# Patient Record
Sex: Male | Born: 1950 | Race: Black or African American | Hispanic: No | Smoking: Never smoker
Health system: Southern US, Community
[De-identification: ages and names within clinical notes are randomized; demographics above are authoritative.]

## PROBLEM LIST (undated history)

## (undated) DIAGNOSIS — I96 Gangrene, not elsewhere classified: Secondary | ICD-10-CM

## (undated) DIAGNOSIS — E1142 Type 2 diabetes mellitus with diabetic polyneuropathy: Secondary | ICD-10-CM

## (undated) DIAGNOSIS — G4733 Obstructive sleep apnea (adult) (pediatric): Secondary | ICD-10-CM

## (undated) DIAGNOSIS — E7800 Pure hypercholesterolemia, unspecified: Secondary | ICD-10-CM

## (undated) DIAGNOSIS — Z9989 Dependence on other enabling machines and devices: Secondary | ICD-10-CM

## (undated) DIAGNOSIS — E119 Type 2 diabetes mellitus without complications: Secondary | ICD-10-CM

## (undated) DIAGNOSIS — I10 Essential (primary) hypertension: Secondary | ICD-10-CM

## (undated) HISTORY — PX: ROTATOR CUFF REPAIR: SHX139

---

## 1990-05-26 HISTORY — PX: HEEL SPUR EXCISION: SHX1733

## 2017-10-20 ENCOUNTER — Other Ambulatory Visit: Payer: Self-pay | Admitting: Physician Assistant

## 2017-10-20 DIAGNOSIS — H93A1 Pulsatile tinnitus, right ear: Secondary | ICD-10-CM

## 2017-10-30 ENCOUNTER — Ambulatory Visit
Admission: RE | Admit: 2017-10-30 | Discharge: 2017-10-30 | Disposition: A | Discharge status: Home / Self Care | Payer: Medicare Other | Source: Ambulatory Visit | Attending: Physician Assistant | Admitting: Physician Assistant

## 2017-10-30 DIAGNOSIS — H93A1 Pulsatile tinnitus, right ear: Secondary | ICD-10-CM

## 2017-10-30 MED ORDER — GADOBENATE DIMEGLUMINE 529 MG/ML IV SOLN
14.0000 mL | Freq: Once | INTRAVENOUS | Status: AC | PRN
  Administered 2017-10-30: 14 mL via INTRAVENOUS

## 2017-12-21 ENCOUNTER — Inpatient Hospital Stay (HOSPITAL_BASED_OUTPATIENT_CLINIC_OR_DEPARTMENT_OTHER)
Admission: EM | Admit: 2017-12-21 | Discharge: 2017-12-29 | DRG: 853 | Disposition: A | Discharge status: Skilled Nursing Facility | Payer: Medicare Other | Attending: Family Medicine | Admitting: Family Medicine

## 2017-12-21 ENCOUNTER — Emergency Department (HOSPITAL_BASED_OUTPATIENT_CLINIC_OR_DEPARTMENT_OTHER): Payer: Medicare Other

## 2017-12-21 ENCOUNTER — Encounter (HOSPITAL_BASED_OUTPATIENT_CLINIC_OR_DEPARTMENT_OTHER): Payer: Self-pay | Admitting: Adult Health

## 2017-12-21 ENCOUNTER — Other Ambulatory Visit: Payer: Self-pay

## 2017-12-21 DIAGNOSIS — A419 Sepsis, unspecified organism: Principal | ICD-10-CM | POA: Diagnosis present

## 2017-12-21 DIAGNOSIS — S88112A Complete traumatic amputation at level between knee and ankle, left lower leg, initial encounter: Secondary | ICD-10-CM

## 2017-12-21 DIAGNOSIS — I96 Gangrene, not elsewhere classified: Secondary | ICD-10-CM

## 2017-12-21 DIAGNOSIS — I10 Essential (primary) hypertension: Secondary | ICD-10-CM | POA: Diagnosis present

## 2017-12-21 DIAGNOSIS — Z79899 Other long term (current) drug therapy: Secondary | ICD-10-CM

## 2017-12-21 DIAGNOSIS — Z7982 Long term (current) use of aspirin: Secondary | ICD-10-CM

## 2017-12-21 DIAGNOSIS — E1152 Type 2 diabetes mellitus with diabetic peripheral angiopathy with gangrene: Secondary | ICD-10-CM | POA: Diagnosis present

## 2017-12-21 DIAGNOSIS — R6520 Severe sepsis without septic shock: Secondary | ICD-10-CM | POA: Diagnosis present

## 2017-12-21 DIAGNOSIS — E861 Hypovolemia: Secondary | ICD-10-CM | POA: Diagnosis present

## 2017-12-21 DIAGNOSIS — G4733 Obstructive sleep apnea (adult) (pediatric): Secondary | ICD-10-CM | POA: Diagnosis present

## 2017-12-21 DIAGNOSIS — H40059 Ocular hypertension, unspecified eye: Secondary | ICD-10-CM | POA: Diagnosis present

## 2017-12-21 DIAGNOSIS — E1165 Type 2 diabetes mellitus with hyperglycemia: Secondary | ICD-10-CM | POA: Diagnosis present

## 2017-12-21 DIAGNOSIS — E871 Hypo-osmolality and hyponatremia: Secondary | ICD-10-CM | POA: Diagnosis present

## 2017-12-21 DIAGNOSIS — E7800 Pure hypercholesterolemia, unspecified: Secondary | ICD-10-CM | POA: Diagnosis present

## 2017-12-21 DIAGNOSIS — E119 Type 2 diabetes mellitus without complications: Secondary | ICD-10-CM

## 2017-12-21 DIAGNOSIS — A480 Gas gangrene: Secondary | ICD-10-CM | POA: Diagnosis present

## 2017-12-21 DIAGNOSIS — D649 Anemia, unspecified: Secondary | ICD-10-CM | POA: Diagnosis not present

## 2017-12-21 DIAGNOSIS — G8918 Other acute postprocedural pain: Secondary | ICD-10-CM

## 2017-12-21 DIAGNOSIS — R17 Unspecified jaundice: Secondary | ICD-10-CM | POA: Diagnosis present

## 2017-12-21 DIAGNOSIS — I129 Hypertensive chronic kidney disease with stage 1 through stage 4 chronic kidney disease, or unspecified chronic kidney disease: Secondary | ICD-10-CM | POA: Diagnosis present

## 2017-12-21 DIAGNOSIS — R112 Nausea with vomiting, unspecified: Secondary | ICD-10-CM

## 2017-12-21 DIAGNOSIS — E43 Unspecified severe protein-calorie malnutrition: Secondary | ICD-10-CM | POA: Diagnosis present

## 2017-12-21 DIAGNOSIS — R197 Diarrhea, unspecified: Secondary | ICD-10-CM

## 2017-12-21 DIAGNOSIS — N182 Chronic kidney disease, stage 2 (mild): Secondary | ICD-10-CM | POA: Diagnosis present

## 2017-12-21 DIAGNOSIS — Z23 Encounter for immunization: Secondary | ICD-10-CM

## 2017-12-21 DIAGNOSIS — S91332A Puncture wound without foreign body, left foot, initial encounter: Secondary | ICD-10-CM | POA: Diagnosis present

## 2017-12-21 DIAGNOSIS — S91342A Puncture wound with foreign body, left foot, initial encounter: Secondary | ICD-10-CM | POA: Diagnosis present

## 2017-12-21 DIAGNOSIS — E1142 Type 2 diabetes mellitus with diabetic polyneuropathy: Secondary | ICD-10-CM | POA: Diagnosis present

## 2017-12-21 DIAGNOSIS — R531 Weakness: Secondary | ICD-10-CM

## 2017-12-21 DIAGNOSIS — R739 Hyperglycemia, unspecified: Secondary | ICD-10-CM

## 2017-12-21 DIAGNOSIS — Z7984 Long term (current) use of oral hypoglycemic drugs: Secondary | ICD-10-CM

## 2017-12-21 DIAGNOSIS — IMO0002 Reserved for concepts with insufficient information to code with codable children: Secondary | ICD-10-CM

## 2017-12-21 DIAGNOSIS — W450XXA Nail entering through skin, initial encounter: Secondary | ICD-10-CM

## 2017-12-21 DIAGNOSIS — E1122 Type 2 diabetes mellitus with diabetic chronic kidney disease: Secondary | ICD-10-CM | POA: Diagnosis present

## 2017-12-21 DIAGNOSIS — N289 Disorder of kidney and ureter, unspecified: Secondary | ICD-10-CM

## 2017-12-21 HISTORY — DX: Type 2 diabetes mellitus with diabetic polyneuropathy: E11.42

## 2017-12-21 HISTORY — DX: Pure hypercholesterolemia, unspecified: E78.00

## 2017-12-21 HISTORY — DX: Dependence on other enabling machines and devices: Z99.89

## 2017-12-21 HISTORY — DX: Type 2 diabetes mellitus without complications: E11.9

## 2017-12-21 HISTORY — DX: Gangrene, not elsewhere classified: I96

## 2017-12-21 HISTORY — DX: Obstructive sleep apnea (adult) (pediatric): G47.33

## 2017-12-21 HISTORY — DX: Essential (primary) hypertension: I10

## 2017-12-21 LAB — COMPREHENSIVE METABOLIC PANEL
ALT: 32 U/L (ref 0–44)
AST: 38 U/L (ref 15–41)
Albumin: 2.7 g/dL — ABNORMAL LOW (ref 3.5–5.0)
Alkaline Phosphatase: 105 U/L (ref 38–126)
Anion gap: 15 (ref 5–15)
BUN: 31 mg/dL — ABNORMAL HIGH (ref 8–23)
CHLORIDE: 90 mmol/L — AB (ref 98–111)
CO2: 22 mmol/L (ref 22–32)
CREATININE: 1.52 mg/dL — AB (ref 0.61–1.24)
Calcium: 8.6 mg/dL — ABNORMAL LOW (ref 8.9–10.3)
GFR calc Af Amer: 53 mL/min — ABNORMAL LOW (ref >60)
GFR calc non Af Amer: 46 mL/min — ABNORMAL LOW (ref >60)
Glucose, Bld: 364 mg/dL — ABNORMAL HIGH (ref 70–99)
POTASSIUM: 4.8 mmol/L (ref 3.5–5.1)
SODIUM: 127 mmol/L — AB (ref 135–145)
Total Bilirubin: 2.2 mg/dL — ABNORMAL HIGH (ref 0.3–1.2)
Total Protein: 7.8 g/dL (ref 6.5–8.1)

## 2017-12-21 LAB — I-STAT CG4 LACTIC ACID, ED: LACTIC ACID, VENOUS: 2.61 mmol/L — AB (ref 0.5–1.9)

## 2017-12-21 LAB — CK: CK TOTAL: 95 U/L (ref 49–397)

## 2017-12-21 LAB — PROTIME-INR
INR: 1.4
PROTHROMBIN TIME: 17 s — AB (ref 11.4–15.2)

## 2017-12-21 MED ORDER — INSULIN ASPART 100 UNIT/ML ~~LOC~~ SOLN
10.0000 [IU] | Freq: Once | SUBCUTANEOUS | Status: AC
  Administered 2017-12-22: 10 [IU] via SUBCUTANEOUS
  Filled 2017-12-21: qty 1

## 2017-12-21 MED ORDER — SODIUM CHLORIDE 0.9 % IV BOLUS
1000.0000 mL | Freq: Once | INTRAVENOUS | Status: AC
  Administered 2017-12-21: 1000 mL via INTRAVENOUS

## 2017-12-21 MED ORDER — SODIUM CHLORIDE 0.9 % IV BOLUS
1000.0000 mL | Freq: Once | INTRAVENOUS | Status: AC
  Administered 2017-12-22: 1000 mL via INTRAVENOUS

## 2017-12-21 MED ORDER — ACETAMINOPHEN 325 MG PO TABS
ORAL_TABLET | ORAL | Status: AC
  Filled 2017-12-21: qty 1

## 2017-12-21 MED ORDER — PIPERACILLIN-TAZOBACTAM 4.5 G IVPB
4.5000 g | Freq: Once | INTRAVENOUS | Status: DC
Start: 2017-12-21 — End: 2017-12-22
  Filled 2017-12-21: qty 100

## 2017-12-21 MED ORDER — TETANUS-DIPHTH-ACELL PERTUSSIS 5-2.5-18.5 LF-MCG/0.5 IM SUSP
0.5000 mL | Freq: Once | INTRAMUSCULAR | Status: AC
  Administered 2017-12-22: 0.5 mL via INTRAMUSCULAR
  Filled 2017-12-21: qty 0.5

## 2017-12-21 MED ORDER — VANCOMYCIN HCL 1000 MG IV SOLR
INTRAVENOUS | Status: AC
  Filled 2017-12-21: qty 2000

## 2017-12-21 MED ORDER — ACETAMINOPHEN 325 MG PO TABS
650.0000 mg | ORAL_TABLET | Freq: Once | ORAL | Status: AC | PRN
  Administered 2017-12-21: 650 mg via ORAL
  Filled 2017-12-21: qty 2

## 2017-12-21 MED ORDER — SODIUM CHLORIDE 0.9 % IV SOLN
2000.0000 mg | Freq: Once | INTRAVENOUS | Status: AC
  Administered 2017-12-22: 2000 mg via INTRAVENOUS
  Filled 2017-12-21: qty 2000

## 2017-12-21 MED ORDER — ONDANSETRON HCL 4 MG/2ML IJ SOLN
4.0000 mg | Freq: Once | INTRAMUSCULAR | Status: AC
  Administered 2017-12-21: 4 mg via INTRAVENOUS
  Filled 2017-12-21: qty 2

## 2017-12-21 NOTE — ED Notes (Signed)
Patient transported to X-ray 

## 2017-12-21 NOTE — ED Triage Notes (Signed)
PResents with 10 days of chills, nausea, vomiti8ng and diarrhea. He is unable to stand without assistance due to fatigue and weakness. Temp here 102.5, HR 115. Pale.

## 2017-12-21 NOTE — ED Provider Notes (Addendum)
[facility] Provider Note: Lowella DellJ. Lane Kristina Mcnorton, MD, FACEP  CSN: [id number] MRN: [id number] ARRIVAL: 12/21/17 at 2222 ROOM: C23C/C23C   CHIEF COMPLAINT  Fever   HISTORY OF PRESENT ILLNESS  12/21/17 10:59 PM Donald [name] is a 67 y.o. male with a 2-week history of generalized weakness, difficulty standing and walking, feeling off balance and feeling lightheaded when he stands.  He has been sleeping more than usual and has had a very poor appetite and decreased oral intake.  He has neuropathy in his legs which is been present for over a year.  He has little sensation in his lower legs and feet.  He stepped on a nail with his left foot 2 or 3 days ago and there is now a foul odor coming from his left shoe which is bloodstained.  He was noted to have a temperature of 102.5 on arrival as well as tachycardia.  Sepsis work-up was initiated by nursing staff.  He denies chest pain, abdominal pain and pain in his foot.  He is having some muscle aches in his legs are not severe.   Past Medical History:  Diagnosis Date  . Diabetes mellitus without complication (HCC)     History reviewed. No pertinent surgical history.  History reviewed. No pertinent family history.  Social History   Tobacco Use  . Smoking status: Never Smoker  . Smokeless tobacco: Never Used  Substance Use Topics  . Alcohol use: Never    Frequency: Never  . Drug use: Never    Prior to Admission medications   Medication Sig Start Date End Date Taking? Authorizing Provider  aspirin EC 81 MG tablet Take 81 mg by mouth daily.    Yes [provider]  dorzolamide-timolol (COSOPT) 22.3-6.8 MG/ML ophthalmic solution Place 1 drop into both eyes 2 times daily. 05/15/17  Yes [provider]  empagliflozin (JARDIANCE) 10 MG TABS tablet Take 10 mg by mouth daily.    Yes [provider]  lisinopril (PRINIVIL,ZESTRIL) 5 MG tablet Take 5 mg by mouth daily.  07/24/16  Yes [provider]    pravastatin (PRAVACHOL) 40 MG tablet Take 40 mg by mouth daily.    Yes [provider]  sitaGLIPtin (JANUVIA) 100 MG tablet Take 100 mg by mouth daily.    Yes [provider]  terazosin (HYTRIN) 1 MG capsule Take 1 mg by mouth at bedtime.    Yes [provider]  Aflibercept 2 MG/0.05ML SOLN by Intravitreal route. 12/16/17   [provider]    Allergies Patient has no known allergies.   REVIEW OF SYSTEMS  Negative except as noted here or in the History of Present Illness.   PHYSICAL EXAMINATION  Initial Vital Signs Blood pressure 140/80, pulse (!) 115, temperature (!) 102.5 F (39.2 C), temperature source Oral, resp. rate 20, height 5\' 5"  (1.651 m), weight 68 kg (150 lb), SpO2 98 %.  Examination General: Well-developed, well-nourished male in no acute distress; appearance consistent with age of record HENT: normocephalic; atraumatic Eyes: pupils equal, round and reactive to light; extraocular muscles intact; pale conjunctivae Neck: supple Heart: regular rate and rhythm Lungs: clear to auscultation bilaterally Abdomen: soft; nondistended; nontender; no masses or hepatosplenomegaly; bowel sounds present Extremities: No deformity; edema, erythema and warmth of left lower leg with gangrenous changes of the left distal foot, patient able to flex and extend the great toe of the left foot only:        Neurologic: Awake, alert and oriented; motor function intact  in all extremities and symmetric except limited examination of left foot and ankle; no facial droop; minimal sensation of lower legs and feet Skin: Warm and dry Psychiatric: Normal mood and affect   RESULTS  Summary of this visit's results, reviewed by myself:   EKG Interpretation  Date/Time:  Monday December 21 2017 23:32:11 EDT Ventricular Rate:  112 PR Interval:    QRS Duration: 92 QT Interval:  319 QTC Calculation: 436 R Axis:   111 Text Interpretation:  Sinus tachycardia Right  axis deviation Probable anteroseptal infarct, old No previous ECGs available Confirmed by Paula Libra ([id number]) on 12/21/2017 11:45:11 PM      Laboratory Studies: Results for orders placed or performed during the hospital encounter of 12/21/17 (from the past 24 hour(s))  Comprehensive metabolic panel     Status: Abnormal   Collection Time: 12/21/17 10:58 PM  Result Value Ref Range   Sodium 127 (L) 135 - 145 mmol/L   Potassium 4.8 3.5 - 5.1 mmol/L   Chloride 90 (L) 98 - 111 mmol/L   CO2 22 22 - 32 mmol/L   Glucose, Bld 364 (H) 70 - 99 mg/dL   BUN 31 (H) 8 - 23 mg/dL   Creatinine, Ser 6.04 (H) 0.61 - 1.24 mg/dL   Calcium 8.6 (L) 8.9 - 10.3 mg/dL   Total Protein 7.8 6.5 - 8.1 g/dL   Albumin 2.7 (L) 3.5 - 5.0 g/dL   AST 38 15 - 41 U/L   ALT 32 0 - 44 U/L   Alkaline Phosphatase 105 38 - 126 U/L   Total Bilirubin 2.2 (H) 0.3 - 1.2 mg/dL   GFR calc non Af Amer 46 (L) >60 mL/min   GFR calc Af Amer 53 (L) >60 mL/min   Anion gap 15 5 - 15  CBC with Differential     Status: Abnormal   Collection Time: 12/21/17 10:58 PM  Result Value Ref Range   WBC 35.9 (H) 4.0 - 10.5 K/uL   RBC 3.31 (L) 4.22 - 5.81 MIL/uL   Hemoglobin 10.5 (L) 13.0 - 17.0 g/dL   HCT 54.0 (L) 98.1 - 19.1 %   MCV 87.6 78.0 - 100.0 fL   MCH 31.7 26.0 - 34.0 pg   MCHC 36.2 (H) 30.0 - 36.0 g/dL   RDW 47.8 (L) 29.5 - 62.1 %   Platelets 380 150 - 400 K/uL   Neutrophils Relative % 86 %   Lymphocytes Relative 3 %   Monocytes Relative 9 %   Eosinophils Relative 0 %   Basophils Relative 0 %   Band Neutrophils 2 %   Neutro Abs 31.6 (H) 1.7 - 7.7 K/uL   Lymphs Abs 1.1 0.7 - 4.0 K/uL   Monocytes Absolute 3.2 (H) 0.1 - 1.0 K/uL   Eosinophils Absolute 0.0 0.0 - 0.7 K/uL   Basophils Absolute 0.0 0.0 - 0.1 K/uL   Smear Review MORPHOLOGY UNREMARKABLE   Protime-INR     Status: Abnormal   Collection Time: 12/21/17 10:58 PM  Result Value Ref Range   Prothrombin Time 17.0 (H) 11.4 - 15.2 seconds   INR 1.40   CK     Status: None     Collection Time: 12/21/17 10:58 PM  Result Value Ref Range   Total CK 95 49 - 397 U/L  I-Stat CG4 Lactic Acid, ED     Status: Abnormal   Collection Time: 12/21/17 11:04 PM  Result Value Ref Range   Lactic Acid, Venous 2.61 (HH) 0.5 - 1.9 mmol/L  Comment NOTIFIED PHYSICIAN   I-Stat CG4 Lactic Acid, ED     Status: None   Collection Time: 12/22/17  1:33 AM  Result Value Ref Range   Lactic Acid, Venous 1.34 0.5 - 1.9 mmol/L  CBG monitoring, ED     Status: Abnormal   Collection Time: 12/22/17  4:36 AM  Result Value Ref Range   Glucose-Capillary 251 (H) 70 - 99 mg/dL   Imaging Studies: Dg Chest 2 View  Result Date: 12/21/2017 CLINICAL DATA:  Chills, fever and weakness. EXAM: CHEST - 2 VIEW COMPARISON:  None. FINDINGS: Cardiac silhouette is upper limits of normal size. Mediastinal silhouette is not suspicious. No pleural effusion or focal consolidation. Mild bronchitic changes. No pneumothorax. Soft tissue planes and included osseous structures are non suspicious. Lower thoracic dextroscoliosis could be positional. Chronic fragmentation RIGHT AC joint. IMPRESSION: 1. Borderline cardiomegaly. 2. Bronchitic changes without focal consolidation. Electronically Signed   By: Awilda Metro M.D.   On: 12/21/2017 23:23   Ct Head Wo Contrast  Result Date: 12/21/2017 CLINICAL DATA:  Fevers, chills, nausea/vomiting EXAM: CT HEAD WITHOUT CONTRAST TECHNIQUE: Contiguous axial images were obtained from the base of the skull through the vertex without intravenous contrast. COMPARISON:  MRI brain dated 10/30/2017 FINDINGS: Brain: No evidence of acute infarction, hemorrhage, hydrocephalus, extra-axial collection or mass lesion/mass effect. Mild cortical atrophy. Mild subcortical white matter and periventricular small vessel ischemic changes. Vascular: Intracranial atherosclerosis. Skull: Normal. Negative for fracture or focal lesion. Sinuses/Orbits: The visualized paranasal sinuses are essentially clear. The  mastoid air cells are unopacified. Other: None. IMPRESSION: No evidence of acute intracranial abnormality. Mild atrophy with small vessel ischemic changes. Electronically Signed   By: Charline Bills M.D.   On: 12/21/2017 23:57   Dg Foot Complete Left  Result Date: 12/22/2017 CLINICAL DATA:  Gangrenous left foot EXAM: LEFT FOOT - COMPLETE 3+ VIEW COMPARISON:  None. FINDINGS: Mottled lucencies/soft tissue gas throughout the left forefoot, overlying the 1st through 4th digits, extending along the dorsal and plantar surfaces. No definite underlying cortical destruction, although osseous evaluation is obscured due to overlying lucencies. No radiopaque foreign body is seen. IMPRESSION: Extensive soft tissue gas throughout the left forefoot, overlying the dorsal and plantar surfaces of the 1st through 4th digits, compatible with the clinical history of soft tissue gangrene. No definite radiographic findings of underlying osseous destruction, noting limited evaluation. Electronically Signed   By: Charline Bills M.D.   On: 12/22/2017 00:16    ED COURSE and MDM  Nursing notes and initial vitals signs, including pulse oximetry, reviewed.  Vitals:   12/22/17 0230 12/22/17 0245 12/22/17 0300 12/22/17 0401  BP: 118/69  121/79 128/63  Pulse:    (!) 106  Resp: (!) 21 (!) 21 (!) 22 (!) 22  Temp:    98.7 F (37.1 C)  TempSrc:    Oral  SpO2:    97%  Weight:      Height:       11:42 PM IV fluid bolus initiated.  IV vancomycin and Zosyn ordered for gangrenous foot.  We will also obtained plain films of the foot to look for osteomyelitis or soft tissue gas.  Will obtain a CT of the head as his 2 weeks of weakness and difficulty ambulating are suggestive of a stroke.  12:33 AM Plan to have patient admitted for likely surgical amputation of the foot or forefoot.  The patient and his family were advised at this eventuality.   1:48 AM Dr. Antionette Char accepts for admission  to the hospitalist service at [facility].   Dr. Roda Shutters of orthopedics will see him in the morning and anticipates amputation.  Lactate improved after IV fluid bolus.  3:03 AM No beds are expected to be open at [facility] overnight.  The patient has been accepted to board in the [facility] ED by Dr. Manus Gunning.  Dr. Antionette Char and Dr. Roda Shutters can see him in the ED.  PROCEDURES   CRITICAL CARE Performed by: Paula Libra L Total critical care time: 45 minutes Critical care time was exclusive of separately billable procedures and treating other patients. Critical care was necessary to treat or prevent imminent or life-threatening deterioration. Critical care was time spent personally by me on the following activities: development of treatment plan with patient and/or surrogate as well as nursing, discussions with consultants, evaluation of patient's response to treatment, examination of patient, obtaining history from patient or surrogate, ordering and performing treatments and interventions, ordering and review of laboratory studies, ordering and review of radiographic studies, pulse oximetry and re-evaluation of patient's condition.   ED DIAGNOSES     ICD-10-CM   1. 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 Nausea, vomiting and diarrhea R11.2    R19.7   3. Generalized weakness R53.1   4. Hyperglycemia R73.9   5. Normocytic anemia D64.9   6. Acute sepsis (HCC) A41.9        Erinn Huskins, MD 12/22/17 0151    Paula Libra, MD 12/22/17 0304    Paula Libra, MD 12/22/17 6045    Paula Libra, MD 12/22/17 0449    Rynlee Lisbon, Jonny Ruiz, MD 12/22/17 [phone]

## 2017-12-21 NOTE — ED Notes (Signed)
Pt reported he stepped on a nail x 3 days ago- EDP removed sock to find gangrenous left foot.

## 2017-12-22 ENCOUNTER — Other Ambulatory Visit (INDEPENDENT_AMBULATORY_CARE_PROVIDER_SITE_OTHER): Payer: Self-pay | Admitting: Orthopedic Surgery

## 2017-12-22 ENCOUNTER — Encounter (HOSPITAL_COMMUNITY): Payer: Self-pay | Admitting: Family Medicine

## 2017-12-22 DIAGNOSIS — E1152 Type 2 diabetes mellitus with diabetic peripheral angiopathy with gangrene: Secondary | ICD-10-CM | POA: Diagnosis present

## 2017-12-22 DIAGNOSIS — A419 Sepsis, unspecified organism: Secondary | ICD-10-CM | POA: Diagnosis present

## 2017-12-22 DIAGNOSIS — D649 Anemia, unspecified: Secondary | ICD-10-CM | POA: Diagnosis present

## 2017-12-22 DIAGNOSIS — N289 Disorder of kidney and ureter, unspecified: Secondary | ICD-10-CM

## 2017-12-22 DIAGNOSIS — R17 Unspecified jaundice: Secondary | ICD-10-CM | POA: Diagnosis present

## 2017-12-22 DIAGNOSIS — R6520 Severe sepsis without septic shock: Secondary | ICD-10-CM | POA: Diagnosis not present

## 2017-12-22 DIAGNOSIS — I96 Gangrene, not elsewhere classified: Secondary | ICD-10-CM

## 2017-12-22 DIAGNOSIS — E119 Type 2 diabetes mellitus without complications: Secondary | ICD-10-CM

## 2017-12-22 DIAGNOSIS — S91342A Puncture wound with foreign body, left foot, initial encounter: Secondary | ICD-10-CM | POA: Diagnosis present

## 2017-12-22 DIAGNOSIS — Z23 Encounter for immunization: Secondary | ICD-10-CM | POA: Diagnosis present

## 2017-12-22 DIAGNOSIS — Z7984 Long term (current) use of oral hypoglycemic drugs: Secondary | ICD-10-CM | POA: Diagnosis not present

## 2017-12-22 DIAGNOSIS — Z89512 Acquired absence of left leg below knee: Secondary | ICD-10-CM | POA: Diagnosis not present

## 2017-12-22 DIAGNOSIS — E1142 Type 2 diabetes mellitus with diabetic polyneuropathy: Secondary | ICD-10-CM | POA: Diagnosis present

## 2017-12-22 DIAGNOSIS — H40059 Ocular hypertension, unspecified eye: Secondary | ICD-10-CM | POA: Diagnosis not present

## 2017-12-22 DIAGNOSIS — G4733 Obstructive sleep apnea (adult) (pediatric): Secondary | ICD-10-CM | POA: Diagnosis not present

## 2017-12-22 DIAGNOSIS — E871 Hypo-osmolality and hyponatremia: Secondary | ICD-10-CM | POA: Diagnosis present

## 2017-12-22 DIAGNOSIS — E1122 Type 2 diabetes mellitus with diabetic chronic kidney disease: Secondary | ICD-10-CM | POA: Diagnosis not present

## 2017-12-22 DIAGNOSIS — E1165 Type 2 diabetes mellitus with hyperglycemia: Secondary | ICD-10-CM | POA: Diagnosis present

## 2017-12-22 DIAGNOSIS — E861 Hypovolemia: Secondary | ICD-10-CM | POA: Diagnosis present

## 2017-12-22 DIAGNOSIS — Z7982 Long term (current) use of aspirin: Secondary | ICD-10-CM | POA: Diagnosis not present

## 2017-12-22 DIAGNOSIS — N182 Chronic kidney disease, stage 2 (mild): Secondary | ICD-10-CM | POA: Diagnosis not present

## 2017-12-22 DIAGNOSIS — I129 Hypertensive chronic kidney disease with stage 1 through stage 4 chronic kidney disease, or unspecified chronic kidney disease: Secondary | ICD-10-CM | POA: Diagnosis present

## 2017-12-22 DIAGNOSIS — I10 Essential (primary) hypertension: Secondary | ICD-10-CM | POA: Diagnosis not present

## 2017-12-22 DIAGNOSIS — G8918 Other acute postprocedural pain: Secondary | ICD-10-CM | POA: Diagnosis not present

## 2017-12-22 DIAGNOSIS — A480 Gas gangrene: Secondary | ICD-10-CM | POA: Diagnosis present

## 2017-12-22 DIAGNOSIS — E43 Unspecified severe protein-calorie malnutrition: Secondary | ICD-10-CM | POA: Diagnosis present

## 2017-12-22 DIAGNOSIS — Z79899 Other long term (current) drug therapy: Secondary | ICD-10-CM | POA: Diagnosis not present

## 2017-12-22 DIAGNOSIS — W450XXA Nail entering through skin, initial encounter: Secondary | ICD-10-CM | POA: Diagnosis not present

## 2017-12-22 DIAGNOSIS — E11628 Type 2 diabetes mellitus with other skin complications: Secondary | ICD-10-CM

## 2017-12-22 DIAGNOSIS — S91332A Puncture wound without foreign body, left foot, initial encounter: Secondary | ICD-10-CM | POA: Diagnosis not present

## 2017-12-22 DIAGNOSIS — E7800 Pure hypercholesterolemia, unspecified: Secondary | ICD-10-CM | POA: Diagnosis not present

## 2017-12-22 LAB — URINALYSIS, ROUTINE W REFLEX MICROSCOPIC
BILIRUBIN URINE: NEGATIVE
Glucose, UA: >=500 mg/dL — AB
Hgb urine dipstick: NEGATIVE
Ketones, ur: NEGATIVE mg/dL
Leukocytes, UA: NEGATIVE
NITRITE: NEGATIVE
PH: 5 (ref 5.0–8.0)
Protein, ur: 30 mg/dL — AB
SPECIFIC GRAVITY, URINE: 1.024 (ref 1.005–1.030)

## 2017-12-22 LAB — C-REACTIVE PROTEIN: CRP: 26.8 mg/dL — AB (ref <1.0)

## 2017-12-22 LAB — CBG MONITORING, ED: Glucose-Capillary: 251 mg/dL — ABNORMAL HIGH (ref 70–99)

## 2017-12-22 LAB — CBC WITH DIFFERENTIAL/PLATELET
BASOS ABS: 0 10*3/uL (ref 0.0–0.1)
Band Neutrophils: 2 %
Basophils Relative: 0 %
EOS ABS: 0 10*3/uL (ref 0.0–0.7)
EOS PCT: 0 %
HCT: 29 % — ABNORMAL LOW (ref 39.0–52.0)
Hemoglobin: 10.5 g/dL — ABNORMAL LOW (ref 13.0–17.0)
LYMPHS ABS: 1.1 10*3/uL (ref 0.7–4.0)
Lymphocytes Relative: 3 %
MCH: 31.7 pg (ref 26.0–34.0)
MCHC: 36.2 g/dL — ABNORMAL HIGH (ref 30.0–36.0)
MCV: 87.6 fL (ref 78.0–100.0)
MONO ABS: 3.2 10*3/uL — AB (ref 0.1–1.0)
Monocytes Relative: 9 %
NEUTROS PCT: 86 %
Neutro Abs: 31.6 10*3/uL — ABNORMAL HIGH (ref 1.7–7.7)
PLATELETS: 380 10*3/uL (ref 150–400)
RBC: 3.31 MIL/uL — AB (ref 4.22–5.81)
RDW: 11.1 % — ABNORMAL LOW (ref 11.5–15.5)
WBC: 35.9 10*3/uL — AB (ref 4.0–10.5)

## 2017-12-22 LAB — HIV ANTIBODY (ROUTINE TESTING W REFLEX): HIV Screen 4th Generation wRfx: NONREACTIVE

## 2017-12-22 LAB — GLUCOSE, CAPILLARY
GLUCOSE-CAPILLARY: 201 mg/dL — AB (ref 70–99)
Glucose-Capillary: 187 mg/dL — ABNORMAL HIGH (ref 70–99)
Glucose-Capillary: 197 mg/dL — ABNORMAL HIGH (ref 70–99)
Glucose-Capillary: 225 mg/dL — ABNORMAL HIGH (ref 70–99)
Glucose-Capillary: 227 mg/dL — ABNORMAL HIGH (ref 70–99)

## 2017-12-22 LAB — CBC
HCT: 27.4 % — ABNORMAL LOW (ref 39.0–52.0)
Hemoglobin: 9.6 g/dL — ABNORMAL LOW (ref 13.0–17.0)
MCH: 31.8 pg (ref 26.0–34.0)
MCHC: 35 g/dL (ref 30.0–36.0)
MCV: 90.7 fL (ref 78.0–100.0)
Platelets: 299 10*3/uL (ref 150–400)
RBC: 3.02 MIL/uL — AB (ref 4.22–5.81)
RDW: 11.5 % (ref 11.5–15.5)
WBC: 31.1 10*3/uL — AB (ref 4.0–10.5)

## 2017-12-22 LAB — BASIC METABOLIC PANEL
ANION GAP: 10 (ref 5–15)
BUN: 26 mg/dL — ABNORMAL HIGH (ref 8–23)
CALCIUM: 8.1 mg/dL — AB (ref 8.9–10.3)
CO2: 23 mmol/L (ref 22–32)
Chloride: 100 mmol/L (ref 98–111)
Creatinine, Ser: 1.45 mg/dL — ABNORMAL HIGH (ref 0.61–1.24)
GFR calc Af Amer: 56 mL/min — ABNORMAL LOW (ref >60)
GFR, EST NON AFRICAN AMERICAN: 48 mL/min — AB (ref >60)
Glucose, Bld: 238 mg/dL — ABNORMAL HIGH (ref 70–99)
POTASSIUM: 4.2 mmol/L (ref 3.5–5.1)
SODIUM: 133 mmol/L — AB (ref 135–145)

## 2017-12-22 LAB — TYPE AND SCREEN
ABO/RH(D): O POS
Antibody Screen: NEGATIVE

## 2017-12-22 LAB — BILIRUBIN, FRACTIONATED(TOT/DIR/INDIR)
BILIRUBIN DIRECT: 0.8 mg/dL — AB (ref 0.0–0.2)
BILIRUBIN TOTAL: 2.2 mg/dL — AB (ref 0.3–1.2)
Indirect Bilirubin: 1.4 mg/dL — ABNORMAL HIGH (ref 0.3–0.9)

## 2017-12-22 LAB — ABO/RH: ABO/RH(D): O POS

## 2017-12-22 LAB — SEDIMENTATION RATE: SED RATE: 129 mm/h — AB (ref 0–16)

## 2017-12-22 LAB — HEMOGLOBIN A1C
HEMOGLOBIN A1C: 5.6 % (ref 4.8–5.6)
Mean Plasma Glucose: 114.02 mg/dL

## 2017-12-22 LAB — I-STAT CG4 LACTIC ACID, ED: LACTIC ACID, VENOUS: 1.34 mmol/L (ref 0.5–1.9)

## 2017-12-22 LAB — PREALBUMIN: Prealbumin: <5 mg/dL — ABNORMAL LOW (ref 18–38)

## 2017-12-22 MED ORDER — SODIUM CHLORIDE 0.9 % IV SOLN
2.0000 g | Freq: Once | INTRAVENOUS | Status: AC
  Administered 2017-12-22: 2 g via INTRAVENOUS
  Filled 2017-12-22: qty 2

## 2017-12-22 MED ORDER — PIPERACILLIN-TAZOBACTAM 3.375 G IVPB: 3.3750 g | Freq: Three times a day (TID) | INTRAVENOUS | Status: DC

## 2017-12-22 MED ORDER — DORZOLAMIDE HCL-TIMOLOL MAL 2-0.5 % OP SOLN: 1.0000 [drp] | Freq: Two times a day (BID) | OPHTHALMIC | Status: DC

## 2017-12-22 MED ORDER — ONDANSETRON HCL 4 MG/2ML IJ SOLN
4.0000 mg | Freq: Four times a day (QID) | INTRAMUSCULAR | Status: DC | PRN
  Administered 2017-12-25: 4 mg via INTRAVENOUS
  Filled 2017-12-22: qty 2

## 2017-12-22 MED ORDER — ACETAMINOPHEN 650 MG RE SUPP: 650.0000 mg | Freq: Four times a day (QID) | RECTAL | Status: DC | PRN

## 2017-12-22 MED ORDER — INSULIN ASPART 100 UNIT/ML ~~LOC~~ SOLN
0.0000 [IU] | SUBCUTANEOUS | Status: DC
  Administered 2017-12-22: 5 [IU] via SUBCUTANEOUS
  Administered 2017-12-22: 2 [IU] via SUBCUTANEOUS
  Administered 2017-12-22 (×2): 3 [IU] via SUBCUTANEOUS
  Administered 2017-12-22: 2 [IU] via SUBCUTANEOUS
  Administered 2017-12-23: 5 [IU] via SUBCUTANEOUS
  Administered 2017-12-23 (×2): 3 [IU] via SUBCUTANEOUS
  Administered 2017-12-23: 2 [IU] via SUBCUTANEOUS
  Administered 2017-12-23 (×2): 5 [IU] via SUBCUTANEOUS
  Administered 2017-12-24: 2 [IU] via SUBCUTANEOUS
  Administered 2017-12-24 (×3): 3 [IU] via SUBCUTANEOUS
  Administered 2017-12-25 (×4): 2 [IU] via SUBCUTANEOUS
  Administered 2017-12-26: 5 [IU] via SUBCUTANEOUS
  Filled 2017-12-22: qty 1

## 2017-12-22 MED ORDER — VANCOMYCIN HCL IN DEXTROSE 1-5 GM/200ML-% IV SOLN
1000.0000 mg | INTRAVENOUS | Status: AC
  Administered 2017-12-22 – 2017-12-26 (×4): 1000 mg via INTRAVENOUS
  Filled 2017-12-22 (×4): qty 200

## 2017-12-22 MED ORDER — SODIUM CHLORIDE 0.9 % IV SOLN: INTRAVENOUS | Status: DC

## 2017-12-22 MED ORDER — SENNOSIDES-DOCUSATE SODIUM 8.6-50 MG PO TABS
1.0000 | ORAL_TABLET | Freq: Every evening | ORAL | Status: DC | PRN
Start: 2017-12-22 — End: 2017-12-29

## 2017-12-22 MED ORDER — PIPERACILLIN-TAZOBACTAM 3.375 G IVPB 30 MIN
3.3750 g | Freq: Once | INTRAVENOUS | Status: AC
Start: 2017-12-22 — End: 2017-12-22
  Administered 2017-12-22: 3.375 g via INTRAVENOUS
  Filled 2017-12-22: qty 50

## 2017-12-22 MED ORDER — METRONIDAZOLE IN NACL 5-0.79 MG/ML-% IV SOLN
500.0000 mg | Freq: Three times a day (TID) | INTRAVENOUS | Status: AC
  Administered 2017-12-22 – 2017-12-26 (×12): 500 mg via INTRAVENOUS
  Filled 2017-12-22 (×12): qty 100

## 2017-12-22 MED ORDER — ONDANSETRON HCL 4 MG PO TABS: 4.0000 mg | ORAL_TABLET | Freq: Four times a day (QID) | ORAL | Status: DC | PRN

## 2017-12-22 MED ORDER — ACETAMINOPHEN 325 MG PO TABS
650.0000 mg | ORAL_TABLET | Freq: Four times a day (QID) | ORAL | Status: DC | PRN
  Administered 2017-12-22 – 2017-12-23 (×3): 650 mg via ORAL
  Filled 2017-12-22 (×3): qty 2

## 2017-12-22 MED ORDER — TIMOLOL MALEATE 0.5 % OP SOLN
1.0000 [drp] | Freq: Two times a day (BID) | OPHTHALMIC | Status: DC
  Administered 2017-12-22 – 2017-12-29 (×14): 1 [drp] via OPHTHALMIC
  Filled 2017-12-22 (×2): qty 5

## 2017-12-22 MED ORDER — PNEUMOCOCCAL VAC POLYVALENT 25 MCG/0.5ML IJ INJ: 0.5000 mL | INJECTION | INTRAMUSCULAR | Status: DC

## 2017-12-22 MED ORDER — SODIUM CHLORIDE 0.9 % IV SOLN
2.0000 g | INTRAVENOUS | Status: AC
  Administered 2017-12-23 – 2017-12-26 (×4): 2 g via INTRAVENOUS
  Filled 2017-12-22 (×4): qty 2

## 2017-12-22 MED ORDER — PRAVASTATIN SODIUM 40 MG PO TABS
40.0000 mg | ORAL_TABLET | Freq: Every day | ORAL | Status: DC
  Administered 2017-12-22 – 2017-12-28 (×7): 40 mg via ORAL
  Filled 2017-12-22 (×7): qty 1

## 2017-12-22 MED ORDER — METRONIDAZOLE IN NACL 5-0.79 MG/ML-% IV SOLN
500.0000 mg | Freq: Once | INTRAVENOUS | Status: AC
  Administered 2017-12-22: 500 mg via INTRAVENOUS
  Filled 2017-12-22: qty 100

## 2017-12-22 MED ORDER — MORPHINE SULFATE (PF) 4 MG/ML IV SOLN: 1.0000 mg | INTRAVENOUS | Status: DC | PRN

## 2017-12-22 MED ORDER — DORZOLAMIDE HCL 2 % OP SOLN
1.0000 [drp] | Freq: Two times a day (BID) | OPHTHALMIC | Status: DC
  Administered 2017-12-22 – 2017-12-29 (×14): 1 [drp] via OPHTHALMIC
  Filled 2017-12-22: qty 10

## 2017-12-22 MED ORDER — PIPERACILLIN-TAZOBACTAM 3.375 G IVPB
INTRAVENOUS | Status: AC
  Filled 2017-12-22: qty 50

## 2017-12-22 MED ORDER — BISACODYL 5 MG PO TBEC: 5.0000 mg | DELAYED_RELEASE_TABLET | Freq: Every day | ORAL | Status: DC | PRN

## 2017-12-22 MED ORDER — SODIUM CHLORIDE 0.9 % IV SOLN
INTRAVENOUS | Status: AC
  Administered 2017-12-22 (×2): via INTRAVENOUS

## 2017-12-22 NOTE — ED Notes (Addendum)
Kathlene NovemberMike (Pt Son) [id number]

## 2017-12-22 NOTE — Progress Notes (Signed)
CSW acknowledges consultfor accesses medications for discharge. For further assistance with this matter please consult RNCM. AT this time there are no further CSW needs. CSW will sign off. If new need arises please reconsult.   Claude MangesKierra S. Rithika Seel, MSW, LCSW-A Emergency Department Clinical Social Worker [phone]

## 2017-12-22 NOTE — Progress Notes (Signed)
Patient admitted earlier today by Dr. Marcial Pacasimothy Opyd.   67 year old male history of type 2 diabetes, peripheral neuropathy, hypertension, presented with generalized weakness, fever and chills, malaise.  Patient noted this started approximately 2 weeks ago.Patient was admitted for sepsis secondary to left foot gangrene.  Tachycardic, febrile with leukocytosis.  Given vancomycin and Zosyn in the emergency department.  Currently on 7, Flagyl, cefepime.  Dr. Lajoyce Cornersuda, orthopedics consulted and appreciated, plan for transtibial amputation later this week.   Dezra Mandella D.O. [facility] Pager [phone]  If 7PM-7AM, please contact night-coverage [email or web address] Password [email or web address] 12/22/2017, 1:09 PM

## 2017-12-22 NOTE — Progress Notes (Signed)
Initial Nutrition Assessment  DOCUMENTATION CODES:   Not applicable  INTERVENTION:   -RD will follow for diet advancement and supplement as appropriate -Consider chaplain consult; offered at visit, however, pt refused  NUTRITION DIAGNOSIS:   Increased nutrient needs related to wound healing, post-op healing as evidenced by estimated needs.  GOAL:   Patient will meet greater than or equal to 90% of their needs  MONITOR:   Diet advancement, Labs, Weight trends, Skin, I & O's  REASON FOR ASSESSMENT:   Consult Wound healing  ASSESSMENT:   Cecilio Ohlrich is a 67 y.o. male with medical history significant for type 2 diabetes mellitus, peripheral neuropathy, and hypertension, now presenting to the emergency department for evaluation of fevers, chills, malaise, and generalized weakness.  Patient reports that he been in his usual state until approximately 2 weeks ago when he began to develop intermittent chills and a general malaise.   Pt admitted with sepsis secondary to lt foot infection with gangrene. Per orthopedics notes, pt will undergo transtibial amputation later this week.  Case discussed with RN prior to visit, who reports that pt is NPO due to possible surgery either today or tomorrow. Per RN, pt stepped on a nail approximately two weeks ago, which he did not notice due to severe neuropathy.   Spoke with pt, daughter, and son-in-law at bedside. Pt endorses a poor appetite over the past two weeks related to food poisoning (pt reports getting sick on 12/12/17 after eating a reduced priced roast beef sandwich from [employer]). Since this period, he complains that "food doesn't taste right" and has been consuming his homemade food (mostly salads). He endorses wt loss, reporting UBW around 150#. Per pt daughter, she suspect poor appetite and weight loss have been occurring for longer than this time frame. No recent wt hx available to assess wt changes.   Pt very hungry and requesting  food during visit, but understanding of NPO order. Pt amenable to supplements once diet is advanced.   Albumin has a half-life of 21 days and is strongly affected by stress response and inflammatory process, therefore, do not expect to see an improvement in this lab value during acute hospitalization. When a patient presents with low albumin, it is likely skewed due to the acute inflammatory response. Note that low albumin is no longer used to diagnose malnutrition; [place] uses the new malnutrition guidelines published by the American Society for Parenteral and Enteral Nutrition (A.S.P.E.N.) and the Academy of Nutrition and Dietetics (AND). Unable to identify malnutrition at this time.   Last Hgb A1c: 5.6 (12/22/17). PTA DM medications 100 mg [place] daily and 10 mg jardiance daily. Spoke with pt and family regarding glycemic control at home. Per pt daughter, pt has experienced a general decline in health over the past few months, as pt's son was tragically killed approximately 3 months ago. Since this time, pt has had little motivation to care for himself. Daughter suspects that pt's DM is very poorly controlled. Pt endorses that he has not been checking blood sugars or taking medications ("it's just another thing on my list that I have to do"). Pt became very tearful during visit; RD offered emotional support to pt. RD asked if pt would like a chaplain consult, however, pt politely declined at this time- encouraged pt to communicate with RN in the event that he changes his mind.   Labs reviewed: CBGS: 197-227 (inpatient orders for glycemic control are 0-9 units insulin aspart every 4 hours).   NUTRITION -  FOCUSED PHYSICAL EXAM:    Most Recent Value  Orbital Region  No depletion  Upper Arm Region  No depletion  Thoracic and Lumbar Region  No depletion  Buccal Region  No depletion  Temple Region  No depletion  Clavicle Bone Region  No depletion  Clavicle and Acromion Bone Region  No depletion   Scapular Bone Region  No depletion  Dorsal Hand  No depletion  Patellar Region  No depletion  Anterior Thigh Region  No depletion  Posterior Calf Region  No depletion  Edema (RD Assessment)  None  Hair  Reviewed  Eyes  Reviewed  Mouth  Reviewed  Skin  Reviewed  Nails  Reviewed       Diet Order:   Diet Order           Diet NPO time specified Except for: Sips with Meds, Ice Chips  Diet effective now          EDUCATION NEEDS:   Education needs have been addressed  Skin:  Skin Assessment: Skin Integrity Issues: Skin Integrity Issues:: Other (Comment) Other: open wound lt foot with gangrene  Last BM:  PTA  Height:   Ht Readings from Last 1 Encounters:  12/22/17 5\' 5"  (1.651 m)    Weight:   Wt Readings from Last 1 Encounters:  12/22/17 146 lb 9.7 oz (66.5 kg)    Ideal Body Weight:  61.8 kg  BMI:  Body mass index is 24.4 kg/m.  Estimated Nutritional Needs:   Kcal:  1800-2000  Protein:  90-105 grams  Fluid:  1.8-2.0 L    Jakyah Bradby A. Mayford KnifeWilliams, RD, LDN, CDE Pager: [phone] After hours Pager: [phone]

## 2017-12-22 NOTE — ED Provider Notes (Signed)
[facility]  Awaiting admission for sepsis and gas gangrene of L foot. Denies pain.   Patient appears ill.  He is in no distress.  Mild tachycardia to 106.  Blood pressure 128/63. Gangrenous left forefoot as depicted in notes by Dr Read DriversMolpus.  IntactDP and PT pulses with Doppler.   patient did receive IV fluids and vancomycin and Zosyn.  We will add IV Flagyl. Blood pressure and mental status remain stable.   Dr. Antionette Charpyd informed of patient's arrival    Glynn Octaveancour, Darius Lundberg, MD 12/22/17 [phone]

## 2017-12-22 NOTE — ED Notes (Signed)
Attempted to give report to floor nurse , floor nurse not available  at this time , will call back in 5 min

## 2017-12-22 NOTE — ED Notes (Signed)
Loading 4.5g of zosyn unavailable at this facility- verbal order given to administer 3.75g

## 2017-12-22 NOTE — H&P (Signed)
History and Physical    Donald [name] ZOX:[id number] DOB: 06/21/1950 DOA: 12/21/2017  PCP: Claybon JabsBrown, Emily, PA-C   [facility]   Chief Complaint: Fever, chills, malaise, gen weakness  HPI: Donald GougeMichael [name] is a 67 y.o. male with medical history significant for type 2 diabetes mellitus, peripheral neuropathy, and hypertension, now presenting to the emergency department for evaluation of fevers, chills, malaise, and generalized weakness.  Patient reports that he been in his usual state until approximately 2 weeks ago when he began to develop intermittent chills and a general malaise.  He had some nausea with nonbloody vomiting and loose stools at that time and was suspecting food poisoning.  Nausea persists, but the vomiting and diarrhea subsided days ago.  He has been experiencing more frequent chills over the last several days as well as worsening in his generalized weakness and malaise.  He denies headache, chest pain, abdominal pain, melena, or hematochezia.  Patient had reported that he may have stepped on a nail 2 or 3 days ago with his left foot.  [facility] ED Course: Upon arrival to the ED, patient is found to be febrile to 39.2 C, tachycardic in the 110s, and with stable blood pressure.  EKG features a sinus tachycardia with rate 112 and right axis deviation.  Noncontrast head CT was negative for acute intracranial abnormality.  Chest x-ray is notable for borderline cardiomegaly and bronchitic changes without focal infiltrate.  Plain films of the left foot demonstrate extensive soft tissue gas  throughout the left forefoot without definite osseous destruction.  Chemistry panel is notable for a glucose of 364, sodium 127, and creatinine of 1.52 with no priors available for comparison.  Total bilirubin is also noted to be 2.2 on the chemistry panel.  CBC features a leukocytosis to 35,900 and a normocytic anemia with hemoglobin of 10.5.  Lactic acid is  elevated to 2.61.  Blood cultures were collected in the ED, 2 L of normal saline administered, and the patient was treated with 10 units of IV NovoLog, vancomycin, Zosyn, and given Tdap.  Orthopedic surgery was consulted by the ED physician and recommended a [facility] for ongoing evaluation and management.  Review of Systems:  All other systems reviewed and apart from HPI, are negative.  Past Medical History:  Diagnosis Date  . Diabetes mellitus without complication (HCC)     History reviewed. No pertinent surgical history.   reports that he has never smoked. He has never used smokeless tobacco. He reports that he does not drink alcohol or use drugs.  No Known Allergies  History reviewed. No pertinent family history.   Prior to Admission medications   Medication Sig Start Date End Date Taking? Authorizing Provider  Aflibercept 2 MG/0.05ML SOLN by Intravitreal route. 12/16/17  Yes [provider]  dorzolamide-timolol (COSOPT) 22.3-6.8 MG/ML ophthalmic solution Place 1 drop into both eyes 2 times daily. 05/15/17  Yes [provider]  lisinopril (PRINIVIL,ZESTRIL) 5 MG tablet  07/24/16  Yes [provider]  aspirin EC 81 MG tablet Take by mouth.    [provider]  empagliflozin (JARDIANCE) 10 MG TABS tablet Take by mouth.    [provider]  pravastatin (PRAVACHOL) 40 MG tablet Take by mouth.    [provider]  sitaGLIPtin (JANUVIA) 100 MG tablet Take by mouth.    [provider]  terazosin (HYTRIN) 1 MG capsule Take by mouth.    [provider]    Physical Exam: Vitals:   12/22/17 0230 12/22/17 0245 12/22/17 0300 12/22/17 0401  BP: 118/69  121/79 128/63  Pulse:    (!) 106  Resp: (!) 21 (!) 21 (!) 22 (!) 22  Temp:    98.7 F (37.1 C)  TempSrc:    Oral  SpO2:    97%  Weight:      Height:          Constitutional: NAD, calm  Eyes: PERTLA, lids and conjunctivae normal ENMT:  Mucous membranes are moist. Posterior pharynx clear of any exudate or lesions.   Neck: normal, supple, no masses, no thyromegaly Respiratory: clear to auscultation bilaterally, no wheezing, no crackles. Normal respiratory effort.    Cardiovascular: Rate ~110 and regular. No significant JVD. Abdomen: No distension, soft, no tenderness. Bowel sounds active.  Musculoskeletal: no clubbing / cyanosis. No joint deformity upper and lower extremities. Left foot findings below.   Skin: Left forefoot cool, dusky, edematous, and with foul odor and skin sloughing. Warm, dry, well-perfused. Neurologic: No facial asymmetry. Sensation to light touch diminished in distal LE's bilaterally. Strength 5/5 in all 4 limbs.  Psychiatric: Alert and oriented to person, place, and situation. Calm, cooperative.    Labs on Admission: I have personally reviewed following labs and imaging studies  CBC: Recent Labs  Lab 12/21/17 2258  WBC 35.9*  NEUTROABS 31.6*  HGB 10.5*  HCT 29.0*  MCV 87.6  PLT 380   Basic Metabolic Panel: Recent Labs  Lab 12/21/17 2258  NA 127*  K 4.8  CL 90*  CO2 22  GLUCOSE 364*  BUN 31*  CREATININE 1.52*  CALCIUM 8.6*   GFR: Estimated Creatinine Clearance: 41 mL/min (A) (by C-G formula based on SCr of 1.52 mg/dL (H)). Liver Function Tests: Recent Labs  Lab 12/21/17 2258  AST 38  ALT 32  ALKPHOS 105  BILITOT 2.2*  PROT 7.8  ALBUMIN 2.7*   No results for input(s): LIPASE, AMYLASE in the last 168 hours. No results for input(s): AMMONIA in the last 168 hours. Coagulation Profile: Recent Labs  Lab 12/21/17 2258  INR 1.40   Cardiac Enzymes: Recent Labs  Lab 12/21/17 2258  CKTOTAL 95   BNP (last 3 results) No results for input(s): PROBNP in the last 8760 hours. HbA1C: No results for input(s): HGBA1C in the last 72 hours. CBG: No results for input(s): GLUCAP in the last 168 hours. Lipid Profile: No results for input(s): CHOL, HDL, LDLCALC, TRIG, CHOLHDL,  LDLDIRECT in the last 72 hours. Thyroid Function Tests: No results for input(s): TSH, T4TOTAL, FREET4, T3FREE, THYROIDAB in the last 72 hours. Anemia Panel: No results for input(s): VITAMINB12, FOLATE, FERRITIN, TIBC, IRON, RETICCTPCT in the last 72 hours. Urine analysis: No results found for: COLORURINE, APPEARANCEUR, LABSPEC, PHURINE, GLUCOSEU, HGBUR, BILIRUBINUR, KETONESUR, PROTEINUR, UROBILINOGEN, NITRITE, LEUKOCYTESUR Sepsis Labs: @LABRCNTIP (procalcitonin:4,lacticidven:4) )No results found for this or any previous visit (from the past 240 hour(s)).   Radiological Exams on Admission: Dg Chest 2 View  Result Date: 12/21/2017 CLINICAL DATA:  Chills, fever and weakness. EXAM: CHEST - 2 VIEW COMPARISON:  None. FINDINGS: Cardiac silhouette is upper limits of normal size. Mediastinal silhouette is not suspicious. No pleural effusion or focal consolidation. Mild bronchitic changes. No pneumothorax. Soft tissue planes and included osseous structures are non suspicious. Lower thoracic dextroscoliosis could be positional. Chronic fragmentation RIGHT AC joint. IMPRESSION: 1. Borderline cardiomegaly. 2. Bronchitic changes without focal consolidation. Electronically Signed   By: Awilda Metro M.D.   On:  12/21/2017 23:23   Ct Head Wo Contrast  Result Date: 12/21/2017 CLINICAL DATA:  Fevers, chills, nausea/vomiting EXAM: CT HEAD WITHOUT CONTRAST TECHNIQUE: Contiguous axial images were obtained from the base of the skull through the vertex without intravenous contrast. COMPARISON:  MRI brain dated 10/30/2017 FINDINGS: Brain: No evidence of acute infarction, hemorrhage, hydrocephalus, extra-axial collection or mass lesion/mass effect. Mild cortical atrophy. Mild subcortical white matter and periventricular small vessel ischemic changes. Vascular: Intracranial atherosclerosis. Skull: Normal. Negative for fracture or focal lesion. Sinuses/Orbits: The visualized paranasal sinuses are essentially clear. The  mastoid air cells are unopacified. Other: None. IMPRESSION: No evidence of acute intracranial abnormality. Mild atrophy with small vessel ischemic changes. Electronically Signed   By: Charline Bills M.D.   On: 12/21/2017 23:57   Dg Foot Complete Left  Result Date: 12/22/2017 CLINICAL DATA:  Gangrenous left foot EXAM: LEFT FOOT - COMPLETE 3+ VIEW COMPARISON:  None. FINDINGS: Mottled lucencies/soft tissue gas throughout the left forefoot, overlying the 1st through 4th digits, extending along the dorsal and plantar surfaces. No definite underlying cortical destruction, although osseous evaluation is obscured due to overlying lucencies. No radiopaque foreign body is seen. IMPRESSION: Extensive soft tissue gas throughout the left forefoot, overlying the dorsal and plantar surfaces of the 1st through 4th digits, compatible with the clinical history of soft tissue gangrene. No definite radiographic findings of underlying osseous destruction, noting limited evaluation. Electronically Signed   By: Charline Bills M.D.   On: 12/22/2017 00:16    EKG: Independently reviewed. Sinus tachycardia (rate 112), RAD.   Assessment/Plan  1. Sepsis secondary to left foot gangrene  - Presents with fever/chills, malaise, and gen weakness, reportedly unaware of gangrenous left foot  - He is febrile and tachycardic on arrival with marked leukocytosis, mild lactate elevation, and stable BP  - Left forefoot is gangrenous with no definite osseous involvement on plain films  - Orthopedic surgery is consulting and much appreciated  - Blood cultures were collected in ED, 2 liters NS was given, and the patient was treated with vancomycin and Zosyn  - Continue empiric abx with vancomycin, cefepime, and Flagyl, follow cultures and clinical course, keep NPO and hold aspirin pending surgical eval    2. Type II DM with hyperglycemia  - No A1c on file - Managed at home with Januvia and Jardiance, held on admission  - Check CBG's  and use SSI with Novolog while in hospital   3. Hyponatremia  - Serum sodium is 127 in ED in setting of marked hyperglycemia and hypovolemia  - He was treated in ED with 10 units IV Novolog and 2 liters IVF  - Continue glycemic-control and IVF hydration, repeat chem panel   4. Renal insufficiency  - SCr is 1.52 on admission with no prior values to determine chronicity, possibly acute in setting of sepsis  - He was fluid-resuscitated in ED with 2 liters NS  - Hold lisinopril, renally-dose medications, continue IVF hydration, repeat chem panel    5. Hyperbilirubinemia  - Total bilirubin is 2.2 on admission with no prior LFT's available  - Transaminases and alkaline phosphatase are normal and there is no RUQ pain  - Fractionate bilirubin     DVT prophylaxis: SCD's  Code Status: Full  Family Communication: Discussed with patient  Consults called: Orthopedic surgery Admission status: Inpatient     Briscoe Deutscher, MD [facility] Pager [phone]  If 7PM-7AM, please contact night-coverage [email or web address] Password [facility]  12/22/2017, 4:23 AM

## 2017-12-22 NOTE — Progress Notes (Addendum)
Inpatient Diabetes Program Recommendations  AACE/ADA: New Consensus Statement on Inpatient Glycemic Control (2015)  Target Ranges:  Prepandial:   less than 140 mg/dL      Peak postprandial:   less than 180 mg/dL (1-2 hours)      Critically ill patients:  140 - 180 mg/dL  Results for Donald [name], Donald [name] (MRN [id number]) as of 12/22/2017 07:58  Ref. Range 12/22/2017 04:36  Glucose-Capillary Latest Ref Range: 70 - 99 mg/dL 409251 (H)   Results for Donald [name], Donald [name] (MRN [id number]) as of 12/22/2017 07:58  Ref. Range 12/21/2017 22:58 12/22/2017 05:13  Glucose Latest Ref Range: 70 - 99 mg/dL 956364 (H) 213238 (H)  Hemoglobin A1C Latest Ref Range: 4.8 - 5.6 %  5.6   Review of Glycemic Control  Diabetes history: DM2 Outpatient Diabetes medications: Jardiance 10 mg daily, Januvia 100 mg daily Current orders for Inpatient glycemic control: Novolog 0-9 units Q4H  Inpatient Diabetes Program Recommendations: Insulin - Basal: Please consider ordering Lantus 7 units Q24H (based on 68 kg x 0.1 units). HgbA1C: A1C 5.6% on 12/22/17 indicating an average glucose of 114 mg/dl over the past 2-3 months.  NOTE: Noted consult for Diabetes Coordinator per foot ulcer order set. Chart reviewed.  Thanks, Orlando PennerMarie Shaunice Levitan, RN, MSN, CDE Diabetes Coordinator Inpatient Diabetes Program [phone] (Team Pager from 8am to 5pm)

## 2017-12-22 NOTE — Progress Notes (Addendum)
Pharmacy Antibiotic Note  Clarisse GougeMichael Bahr is a 67 y.o. male admitted on 12/21/2017 with gangrenous foot.  Pharmacy has been consulted for Vancocin and cefepime dosing.  Plan: Vancomycin 2000mg  x1 then 1000mg  IV every 24 hours.  Goal trough 10-20 mcg/mL. Cefepime 2g IV every 24 hours.  Height: 5\' 5"  (165.1 cm) Weight: 150 lb (68 kg) IBW/kg (Calculated) : 61.5  Temp (24hrs), Avg:101.3 F (38.5 C), Min:100.1 F (37.8 C), Max:102.5 F (39.2 C)  Recent Labs  Lab 12/21/17 2258 12/21/17 2304 12/22/17 0133  WBC 35.9*  --   --   CREATININE 1.52*  --   --   LATICACIDVEN  --  2.61* 1.34    Estimated Creatinine Clearance: 41 mL/min (A) (by C-G formula based on SCr of 1.52 mg/dL (H)).    No Known Allergies   Thank you for allowing pharmacy to be a part of this patient's care.  Vernard GamblesVeronda Raliegh Scobie, PharmD, BCPS  12/22/2017 2:22 AM

## 2017-12-22 NOTE — Consult Note (Signed)
ORTHOPAEDIC CONSULTATION  REQUESTING PHYSICIAN: Opyd, Lavone Neri, MD  Chief Complaint: Nausea and vomiting  HPI: Donald [name] is a 67 y.o. male who presents with diabetic insensate neuropathy with gangrenous left foot.  Patient states that he had a screw in his shoe that he was unaware of.  Patient states the dogs were licking his foot.  Past Medical History:  Diagnosis Date  . Diabetes mellitus without complication (HCC)    History reviewed. No pertinent surgical history. Social History   Socioeconomic History  . Marital status: Unknown    Spouse name: Not on file  . Number of children: Not on file  . Years of education: Not on file  . Highest education level: Not on file  Occupational History  . Not on file  Social Needs  . Financial resource strain: Not on file  . Food insecurity:    Worry: Not on file    Inability: Not on file  . Transportation needs:    Medical: Not on file    Non-medical: Not on file  Tobacco Use  . Smoking status: Never Smoker  . Smokeless tobacco: Never Used  Substance and Sexual Activity  . Alcohol use: Never    Frequency: Never  . Drug use: Never  . Sexual activity: Not on file  Lifestyle  . Physical activity:    Days per week: Not on file    Minutes per session: Not on file  . Stress: Not on file  Relationships  . Social connections:    Talks on phone: Not on file    Gets together: Not on file    Attends religious service: Not on file    Active member of club or organization: Not on file    Attends meetings of clubs or organizations: Not on file    Relationship status: Not on file  Other Topics Concern  . Not on file  Social History Narrative  . Not on file   History reviewed. No pertinent family history. - negative except otherwise stated in the family history section No Known Allergies Prior to Admission medications   Medication Sig Start Date End Date Taking? Authorizing Provider  aspirin EC 81 MG tablet Take 81 mg by  mouth daily.    Yes [provider]  dorzolamide-timolol (COSOPT) 22.3-6.8 MG/ML ophthalmic solution Place 1 drop into both eyes 2 times daily. 05/15/17  Yes [provider]  empagliflozin (JARDIANCE) 10 MG TABS tablet Take 10 mg by mouth daily.    Yes [provider]  lisinopril (PRINIVIL,ZESTRIL) 5 MG tablet Take 5 mg by mouth daily.  07/24/16  Yes [provider]  pravastatin (PRAVACHOL) 40 MG tablet Take 40 mg by mouth daily.    Yes [provider]  sitaGLIPtin (JANUVIA) 100 MG tablet Take 100 mg by mouth daily.    Yes [provider]  terazosin (HYTRIN) 1 MG capsule Take 1 mg by mouth at bedtime.    Yes [provider]  Aflibercept 2 MG/0.05ML SOLN by Intravitreal route. 12/16/17   [provider]   Dg Chest 2 View  Result Date: 12/21/2017 CLINICAL DATA:  Chills, fever and weakness. EXAM: CHEST - 2 VIEW COMPARISON:  None. FINDINGS: Cardiac silhouette is upper limits of normal size. Mediastinal silhouette is not suspicious. No pleural effusion or focal consolidation. Mild bronchitic changes. No pneumothorax. Soft tissue planes and included osseous structures are non suspicious. Lower thoracic dextroscoliosis could be positional. Chronic fragmentation RIGHT AC joint. IMPRESSION: 1. Borderline cardiomegaly. 2.  Bronchitic changes without focal consolidation. Electronically Signed   By: Awilda Metroourtnay  Bloomer M.D.   On: 12/21/2017 23:23   Ct Head Wo Contrast  Result Date: 12/21/2017 CLINICAL DATA:  Fevers, chills, nausea/vomiting EXAM: CT HEAD WITHOUT CONTRAST TECHNIQUE: Contiguous axial images were obtained from the base of the skull through the vertex without intravenous contrast. COMPARISON:  MRI brain dated 10/30/2017 FINDINGS: Brain: No evidence of acute infarction, hemorrhage, hydrocephalus, extra-axial collection or mass lesion/mass effect. Mild cortical atrophy. Mild subcortical white matter and periventricular small vessel  ischemic changes. Vascular: Intracranial atherosclerosis. Skull: Normal. Negative for fracture or focal lesion. Sinuses/Orbits: The visualized paranasal sinuses are essentially clear. The mastoid air cells are unopacified. Other: None. IMPRESSION: No evidence of acute intracranial abnormality. Mild atrophy with small vessel ischemic changes. Electronically Signed   By: Charline BillsSriyesh  Krishnan M.D.   On: 12/21/2017 23:57   Dg Foot Complete Left  Result Date: 12/22/2017 CLINICAL DATA:  Gangrenous left foot EXAM: LEFT FOOT - COMPLETE 3+ VIEW COMPARISON:  None. FINDINGS: Mottled lucencies/soft tissue gas throughout the left forefoot, overlying the 1st through 4th digits, extending along the dorsal and plantar surfaces. No definite underlying cortical destruction, although osseous evaluation is obscured due to overlying lucencies. No radiopaque foreign body is seen. IMPRESSION: Extensive soft tissue gas throughout the left forefoot, overlying the dorsal and plantar surfaces of the 1st through 4th digits, compatible with the clinical history of soft tissue gangrene. No definite radiographic findings of underlying osseous destruction, noting limited evaluation. Electronically Signed   By: Charline BillsSriyesh  Krishnan M.D.   On: 12/22/2017 00:16   - pertinent xrays, CT, MRI studies were reviewed and independently interpreted  Positive ROS: All other systems have been reviewed and were otherwise negative with the exception of those mentioned in the HPI and as above.  Physical Exam: General: Alert, no acute distress Psychiatric: Patient is competent for consent with normal mood and affect Lymphatic: No axillary or cervical lymphadenopathy Cardiovascular: No pedal edema Respiratory: No cyanosis, no use of accessory musculature GI: No organomegaly, abdomen is soft and non-tender    Images:  @ENCIMAGES @  Labs:  Lab Results  Component Value Date   HGBA1C 5.6 12/22/2017   ESRSEDRATE 129 (H) 12/22/2017    Lab Results   Component Value Date   ALBUMIN 2.7 (L) 12/21/2017   PREALBUMIN <5 (L) 12/22/2017    Neurologic: Patient does not have protective sensation bilateral lower extremities.   MUSCULOSKELETAL:   Skin: Examination patient has wet gangrenous changes of the left midfoot and forefoot.  Patient has a strong palpable dorsalis pedis and posterior tibial pulse on the right he has a palpable dorsalis pedis pulse on the left.  Patient's hemoglobin A1c is 5.6, albumin 2.7 with severe protein caloric malnutrition.  Prealbumin less than 5.    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  Severe protein caloric malnutrition.  Plan: Plan: We will plan for a left transtibial amputation will schedule later this week.  Continue IV antibiotics to decrease systemic symptoms localized infection to the foot and plan for transtibial amputation with discharge to skilled nursing.  Thank you for the consult and the opportunity to see Mr. Buzzy HanStewart  Mirca Yale, MD [facility] [phone] 6:47 AM

## 2017-12-23 LAB — GLUCOSE, CAPILLARY
GLUCOSE-CAPILLARY: 279 mg/dL — AB (ref 70–99)
Glucose-Capillary: 232 mg/dL — ABNORMAL HIGH (ref 70–99)
Glucose-Capillary: 191 mg/dL — ABNORMAL HIGH (ref 70–99)
Glucose-Capillary: 261 mg/dL — ABNORMAL HIGH (ref 70–99)
Glucose-Capillary: 235 mg/dL — ABNORMAL HIGH (ref 70–99)
Glucose-Capillary: 277 mg/dL — ABNORMAL HIGH (ref 70–99)

## 2017-12-23 MED ORDER — TERAZOSIN HCL 1 MG PO CAPS
1.0000 mg | ORAL_CAPSULE | Freq: Every day | ORAL | Status: DC
  Administered 2017-12-23 – 2017-12-28 (×6): 1 mg via ORAL
  Filled 2017-12-23 (×7): qty 1

## 2017-12-23 MED ORDER — LINAGLIPTIN 5 MG PO TABS
5.0000 mg | ORAL_TABLET | Freq: Every day | ORAL | Status: DC
  Administered 2017-12-23 – 2017-12-29 (×6): 5 mg via ORAL
  Filled 2017-12-23 (×6): qty 1

## 2017-12-23 MED ORDER — CANAGLIFLOZIN 100 MG PO TABS
100.0000 mg | ORAL_TABLET | Freq: Every day | ORAL | Status: DC
  Administered 2017-12-24 – 2017-12-29 (×5): 100 mg via ORAL
  Filled 2017-12-23 (×6): qty 1

## 2017-12-23 MED ORDER — GLUCERNA SHAKE PO LIQD
237.0000 mL | Freq: Every day | ORAL | Status: DC
  Administered 2017-12-23 – 2017-12-28 (×4): 237 mL via ORAL

## 2017-12-23 MED ORDER — JUVEN PO PACK
1.0000 | PACK | Freq: Two times a day (BID) | ORAL | Status: DC
  Administered 2017-12-23 – 2017-12-28 (×6): 1 via ORAL
  Filled 2017-12-23 (×14): qty 1

## 2017-12-23 MED ORDER — ADULT MULTIVITAMIN W/MINERALS CH
1.0000 | ORAL_TABLET | Freq: Every day | ORAL | Status: DC
  Administered 2017-12-23 – 2017-12-29 (×6): 1 via ORAL
  Filled 2017-12-23 (×6): qty 1

## 2017-12-23 NOTE — Social Work (Signed)
CSW following for post surgical recommendations.   Donald HutchingIsabel H Tyree Fluharty, LCSWA [facility] Clinical Social Work [phone]

## 2017-12-23 NOTE — Progress Notes (Signed)
[facility] PROGRESS NOTE  Donald GougeMichael [name] ZOX:[id number] DOB: 01/29/1951 DOA: 12/21/2017 PCP: Claybon JabsBrown, Emily, PA-C   Narrative: 67 year old male-[place] [moved here to be with family], DM TY 2+ neuropathy, HTN, normochromic normocytic anemia neck, probable daily drinker, BPH, hypogonadism, vitamin D deficiency, ocular hypertension [facility]  admit 7/30 nausea chills malaise in a setting of stepping on a nail with his left foot 2 to 3 days prior Found to be febrile 39.2 tachycardia 110 blood pressure stable sinus tach 120 right axis deviation Pertinent labs glucose 364 sodium 127 creatinine 1.5 bili 2.2 WBC 35 hemoglobin 10 lactic acid 2.6 Orthopedics consulted-Vanco Zosyn Tdap-   A & Plan Severe sepsis secondary to likely wound infection vision has diabetic peripheral neuropathy probably etiology of the same-will need transtibial amputation this week as per Dr. 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-monitor sugars-cbg 191-276-continue current meds and adde dback lnagliptin and invokana [subsitutions for homemed] BPH with hypogonadism-outpatient follow-up primary care Ocular hypertension-[facility] Elevated bilirubin-suspect he drinks more than he lets on-monitor trends in am BPH-resumed home Terazosin htn-holding lisinopril 5-bp tredns ok    DVT prophylaxis: lovenox  Code Status: full   Family Communication: none   Disposition Plan: inpatient-unclear dispo at this time -need surgery and then therapy input   Mahala MenghiniSamtani, MD  [facility] Direct contact: [phone] --Via amion app OR  --[email or web address]; password [email or web address]  7PM-7AM contact night coverage as above 12/23/2017, 8:04 AM  LOS: 1 day   Consultants:  Dr Lajoyce Cornersduda  Procedures:  No   Antimicrobials:  As abve  Interval history/Subjective: Awake alert  eatign No distress no pain no fever no chills  Objective:  Vitals:  Vitals:   12/22/17 2030 12/23/17 0504  BP: 140/70 122/79  Pulse: (!) 107 99  Resp: 18 20  Temp: 99.8 F (37.7 C) 99.3 F (37.4 C)  SpO2: 98% 99%    Exam:  . Awake alert looks about stated age eomi ncat . mallampatti2 mod dentition . cta b . abd soft nt dn no rebound no guard . bno le edema . LL not examined   I have personally reviewed the following:   Labs:  none  Imaging studies:  none  Medical tests:  no   Test discussed with performing physician:  none  Decision to obtain old records:  yea  Review and summation of old records:  yes  Scheduled Meds: . dorzolamide  1 drop Both Eyes BID   And  . timolol  1 drop Both Eyes BID  . insulin aspart  0-9 Units Subcutaneous Q4H  . pneumococcal 23 valent vaccine  0.5 mL Intramuscular Tomorrow-1000  . pravastatin  40 mg Oral q1800   Continuous Infusions: . ceFEPime (MAXIPIME) IV Stopped (12/23/17 0540)  . metronidazole Stopped (12/23/17 0618)  . vancomycin Stopped (12/23/17 0052)    Principal Problem:   Gangrene of left foot (HCC) Active Problems:   Type II diabetes mellitus (HCC)   Essential hypertension   Renal insufficiency, mild   Hyponatremia   Hyperbilirubinemia   LOS: 1 day

## 2017-12-23 NOTE — Progress Notes (Signed)
Nutrition Follow-up  DOCUMENTATION CODES:   Not applicable  INTERVENTION:   -1 packet Juven BID, each packet provides 80 calories, 8 grams of carbohydrate, and 14 grams of amino acids; supplement contains CaHMB, glutamine, and arginine, to promote wound healing -Glucerna Shake po q HS, each supplement provides 220 kcal and 10 grams of protein -MVI with minerals daily  NUTRITION DIAGNOSIS:   Increased nutrient needs related to wound healing, post-op healing as evidenced by estimated needs.  Ongoing  GOAL:   Patient will meet greater than or equal to 90% of their needs  Progressing  MONITOR:   Diet advancement, Labs, Weight trends, Skin, I & O's  REASON FOR ASSESSMENT:   Consult Wound healing  ASSESSMENT:   Donald [name] is a 67 y.o. male with medical history significant for type 2 diabetes mellitus, peripheral neuropathy, and hypertension, now presenting to the emergency department for evaluation of fevers, chills, malaise, and generalized weakness.  Patient reports that he been in his usual state until approximately 2 weeks ago when he began to develop intermittent chills and a general malaise.   7/30- advanced to heart healthy/ carb modified diet  RD last evaluated pt on 12/22/17; please see note for further details. Per orthopedics notes, pt will likely undergo transtibial amputation later this week.   No meal completion orders noted, however, pt expressed hunger at time of visit. He is amenable to nutritional supplements, which RD will now provide with diet advancement.   Labs reviewed: CBGS: 187-232 (inpatient orders for glycemic control are 0-9 units insulin aspart every 4 hours).   Diet Order:   Diet Order           Diet heart healthy/carb modified Room service appropriate? Yes; Fluid consistency: Thin  Diet effective now          EDUCATION NEEDS:   Education needs have been addressed  Skin:  Skin Assessment: Skin Integrity Issues: Skin Integrity  Issues:: Other (Comment) Other: open wound lt foot with gangrene  Last BM:  12/22/17  Height:   Ht Readings from Last 1 Encounters:  12/22/17 5\' 5"  (1.651 m)    Weight:   Wt Readings from Last 1 Encounters:  12/23/17 153 lb 14.1 oz (69.8 kg)    Ideal Body Weight:  61.8 kg  BMI:  Body mass index is 25.61 kg/m.  Estimated Nutritional Needs:   Kcal:  1800-2000  Protein:  90-105 grams  Fluid:  1.8-2.0 L    Donald [name], RD, LDN, CDE Pager: [phone] After hours Pager: [phone]

## 2017-12-24 LAB — CBC WITH DIFFERENTIAL/PLATELET
BASOS PCT: 0 %
Basophils Absolute: 0 10*3/uL (ref 0.0–0.1)
EOS ABS: 0.3 10*3/uL (ref 0.0–0.7)
EOS PCT: 1 %
HCT: 25.8 % — ABNORMAL LOW (ref 39.0–52.0)
Hemoglobin: 8.7 g/dL — ABNORMAL LOW (ref 13.0–17.0)
LYMPHS PCT: 6 %
Lymphs Abs: 1.7 10*3/uL (ref 0.7–4.0)
MCH: 30.5 pg (ref 26.0–34.0)
MCHC: 33.7 g/dL (ref 30.0–36.0)
MCV: 90.5 fL (ref 78.0–100.0)
Monocytes Absolute: 2.2 10*3/uL — ABNORMAL HIGH (ref 0.1–1.0)
Monocytes Relative: 8 %
NEUTROS PCT: 85 %
Neutro Abs: 23.9 10*3/uL — ABNORMAL HIGH (ref 1.7–7.7)
PLATELETS: 366 10*3/uL (ref 150–400)
RBC: 2.85 MIL/uL — AB (ref 4.22–5.81)
RDW: 12.1 % (ref 11.5–15.5)
WBC: 28.1 10*3/uL — AB (ref 4.0–10.5)

## 2017-12-24 LAB — SURGICAL PCR SCREEN
MRSA, PCR: NEGATIVE
STAPHYLOCOCCUS AUREUS: NEGATIVE

## 2017-12-24 LAB — COMPREHENSIVE METABOLIC PANEL
ALT: 17 U/L (ref 0–44)
ANION GAP: 11 (ref 5–15)
AST: 17 U/L (ref 15–41)
Albumin: 1.7 g/dL — ABNORMAL LOW (ref 3.5–5.0)
Alkaline Phosphatase: 93 U/L (ref 38–126)
BUN: 31 mg/dL — ABNORMAL HIGH (ref 8–23)
CHLORIDE: 98 mmol/L (ref 98–111)
CO2: 22 mmol/L (ref 22–32)
Calcium: 8.2 mg/dL — ABNORMAL LOW (ref 8.9–10.3)
Creatinine, Ser: 1.35 mg/dL — ABNORMAL HIGH (ref 0.61–1.24)
GFR calc non Af Amer: 53 mL/min — ABNORMAL LOW (ref >60)
Glucose, Bld: 231 mg/dL — ABNORMAL HIGH (ref 70–99)
Potassium: 3.6 mmol/L (ref 3.5–5.1)
SODIUM: 131 mmol/L — AB (ref 135–145)
Total Bilirubin: 1.2 mg/dL (ref 0.3–1.2)
Total Protein: 6 g/dL — ABNORMAL LOW (ref 6.5–8.1)

## 2017-12-24 LAB — GLUCOSE, CAPILLARY
GLUCOSE-CAPILLARY: 241 mg/dL — AB (ref 70–99)
GLUCOSE-CAPILLARY: 207 mg/dL — AB (ref 70–99)
GLUCOSE-CAPILLARY: 117 mg/dL — AB (ref 70–99)
Glucose-Capillary: 210 mg/dL — ABNORMAL HIGH (ref 70–99)
Glucose-Capillary: 175 mg/dL — ABNORMAL HIGH (ref 70–99)

## 2017-12-24 MED ORDER — CHLORHEXIDINE GLUCONATE 4 % EX LIQD
60.0000 mL | Freq: Once | CUTANEOUS | Status: AC
  Administered 2017-12-25: 4 via TOPICAL

## 2017-12-24 NOTE — H&P (View-Only) (Signed)
Patient ID: Donald [name], male   DOB: 08/09/1950, 67 y.o.   MRN: [id number] Patient without complaints this morning.  Odor from the left foot.  Plan for transtibial amputation tomorrow, Friday. 

## 2017-12-24 NOTE — Progress Notes (Signed)
[facility] PROGRESS NOTE  Donald [name] ZOX:[id number] DOB: 12/22/1950 DOA: 12/21/2017 PCP: Claybon JabsBrown, Emily, PA-C   Narrative: 67 year old male-[place]iginally from [place] [moved here to be with family], DM TY 2+ neuropathy, HTN, normochromic normocytic anemia neck, probable daily drinker, BPH, hypogonadism, vitamin D deficiency, ocular hypertension [facility]  admit 7/30 nausea chills malaise in a setting of stepping on a nail with his left foot 2 to 3 days prior Found to be febrile 39.2 tachycardia 110 blood pressure stable sinus tach 120 right axis deviation Pertinent labs glucose 364 sodium 127 creatinine 1.5 bili 2.2 WBC 35 hemoglobin 10 lactic acid 2.6 Orthopedics consulted-Vanco Zosyn Tdap-will need Amputation   A & Plan  Severe sepsis secondary to likely wound infection vision has diabetic peripheral neuropathy probably etiology of the same-transtibial amput per Dr. 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-monitor sugars-cbg 191-276-continue current meds and added back lnagliptin and invokana [subsitutions for homemed]  BPH with hypogonadism-outpatient follow-up primary care  Ocular hypertension-follow-up with [facility]  Elevated bilirubin-suspect he drinks more than he lets on-lft normalizing-suspect 2/2 sepsis>ETIOH  BPH-resumed home Terazosin  Dilutional normocytic anemia-stable  htn-holding lisinopril 5-bp tredns ok  H/o of hoarding and possible conversion disorder-per daughter house in unkempt and unsafe and she is removing his 7 gods from that locale    DVT prophylaxis: lovenox  Code Status: full   Family Communication: long discussion with daughter who is an Charity fundraiserN at Cancer center Disposition Plan: inpatient-unclear dispo at this time -need surgery and then therapy input   Mahala MenghiniSamtani, MD  [facility] Direct contact:  [phone] --Via amion app OR  --[email or web address]; password [email or web address]  7PM-7AM contact night coverage as above 12/24/2017, 4:15 PM  LOS: 2 days   Consultants:  Dr Lajoyce Cornersduda  Procedures:  No   Antimicrobials:  As abve  Interval history/Subjective:  Awake Well no new issue  Objective:  Vitals:  Vitals:   12/24/17 0608 12/24/17 1403  BP: 133/73 138/79  Pulse: 89 (!) 103  Resp: 16 14  Temp: 99.5 F (37.5 C) 99.4 F (37.4 C)  SpO2: 99% 98%    Exam:  . Awake alert looks about stated age eomi ncat . mallampatti2 mod dentition . cta b . abd soft nt dn no rebound no guard . bno le edema . LL not examined--malodourous   I have personally reviewed the following:   Labs:  Wbc 31->28, hemoglobin  9.6-->8.7  Imaging studies:  none  Medical tests:  no   Test discussed with performing physician:  none  Decision to obtain old records:  yea  Review and summation of old records:  yes  Scheduled Meds: . canagliflozin  100 mg Oral QAC breakfast  . chlorhexidine  60 mL Topical Once  . dorzolamide  1 drop Both Eyes BID   And  . timolol  1 drop Both Eyes BID  . feeding supplement (GLUCERNA SHAKE)  237 mL Oral QHS  . insulin aspart  0-9 Units Subcutaneous Q4H  . linagliptin  5 mg Oral Daily  . multivitamin with minerals  1 tablet Oral Daily  . nutrition supplement (JUVEN)  1 packet Oral BID BM  . pneumococcal 23 valent vaccine  0.5 mL Intramuscular Tomorrow-1000  . pravastatin  40 mg Oral q1800  . terazosin  1 mg Oral QHS   Continuous Infusions: . ceFEPime (MAXIPIME) IV 2 g (12/24/17 0332)  . metronidazole 500 mg (  12/24/17 1340)  . vancomycin 1,000 mg (12/23/17 2340)    Principal Problem:   Gangrene of left foot (HCC) Active Problems:   Type II diabetes mellitus (HCC)   Essential hypertension   Renal insufficiency, mild   Hyponatremia   Hyperbilirubinemia   LOS: 2 days

## 2017-12-24 NOTE — Progress Notes (Signed)
Patient ID: Donald GougeMichael [name], male   DOB: 09/17/1950, 67 y.o.   MRN: [id number] Patient without complaints this morning.  Odor from the left foot.  Plan for transtibial amputation tomorrow, Friday.

## 2017-12-25 ENCOUNTER — Encounter (HOSPITAL_COMMUNITY): Payer: Self-pay | Admitting: *Deleted

## 2017-12-25 ENCOUNTER — Encounter (HOSPITAL_COMMUNITY)
Admission: EM | Disposition: A | Discharge status: Skilled Nursing Facility | Payer: Self-pay | Source: Home / Self Care | Attending: Family Medicine

## 2017-12-25 ENCOUNTER — Inpatient Hospital Stay (HOSPITAL_COMMUNITY): Payer: Medicare Other | Admitting: Anesthesiology

## 2017-12-25 DIAGNOSIS — A480 Gas gangrene: Secondary | ICD-10-CM

## 2017-12-25 HISTORY — PX: AMPUTATION: SHX166

## 2017-12-25 LAB — COMPREHENSIVE METABOLIC PANEL
ALK PHOS: 110 U/L (ref 38–126)
ALT: 29 U/L (ref 0–44)
AST: 59 U/L — ABNORMAL HIGH (ref 15–41)
Albumin: 1.7 g/dL — ABNORMAL LOW (ref 3.5–5.0)
Anion gap: 9 (ref 5–15)
BILIRUBIN TOTAL: 1.7 mg/dL — AB (ref 0.3–1.2)
BUN: 29 mg/dL — ABNORMAL HIGH (ref 8–23)
CALCIUM: 8.2 mg/dL — AB (ref 8.9–10.3)
CO2: 21 mmol/L — AB (ref 22–32)
CREATININE: 1.31 mg/dL — AB (ref 0.61–1.24)
Chloride: 100 mmol/L (ref 98–111)
GFR, EST NON AFRICAN AMERICAN: 55 mL/min — AB (ref >60)
Glucose, Bld: 166 mg/dL — ABNORMAL HIGH (ref 70–99)
Potassium: 3.9 mmol/L (ref 3.5–5.1)
Sodium: 130 mmol/L — ABNORMAL LOW (ref 135–145)
TOTAL PROTEIN: 5.9 g/dL — AB (ref 6.5–8.1)

## 2017-12-25 LAB — CBC WITH DIFFERENTIAL/PLATELET
BASOS PCT: 0 %
Basophils Absolute: 0 10*3/uL (ref 0.0–0.1)
EOS ABS: 0.3 10*3/uL (ref 0.0–0.7)
Eosinophils Relative: 1 %
HCT: 25.9 % — ABNORMAL LOW (ref 39.0–52.0)
Hemoglobin: 8.8 g/dL — ABNORMAL LOW (ref 13.0–17.0)
Lymphocytes Relative: 6 %
Lymphs Abs: 1.7 10*3/uL (ref 0.7–4.0)
MCH: 31 pg (ref 26.0–34.0)
MCHC: 34 g/dL (ref 30.0–36.0)
MCV: 91.2 fL (ref 78.0–100.0)
MONO ABS: 2.5 10*3/uL — AB (ref 0.1–1.0)
Monocytes Relative: 9 %
Neutro Abs: 23.8 10*3/uL — ABNORMAL HIGH (ref 1.7–7.7)
Neutrophils Relative %: 84 %
Platelets: 451 10*3/uL — ABNORMAL HIGH (ref 150–400)
RBC: 2.84 MIL/uL — ABNORMAL LOW (ref 4.22–5.81)
RDW: 12.7 % (ref 11.5–15.5)
WBC: 28.3 10*3/uL — AB (ref 4.0–10.5)

## 2017-12-25 LAB — GLUCOSE, CAPILLARY
Glucose-Capillary: 158 mg/dL — ABNORMAL HIGH (ref 70–99)
Glucose-Capillary: 160 mg/dL — ABNORMAL HIGH (ref 70–99)
Glucose-Capillary: 170 mg/dL — ABNORMAL HIGH (ref 70–99)
Glucose-Capillary: 176 mg/dL — ABNORMAL HIGH (ref 70–99)
Glucose-Capillary: 176 mg/dL — ABNORMAL HIGH (ref 70–99)
Glucose-Capillary: 179 mg/dL — ABNORMAL HIGH (ref 70–99)

## 2017-12-25 SURGERY — AMPUTATION BELOW KNEE
Anesthesia: General | Laterality: Left

## 2017-12-25 MED ORDER — MIDAZOLAM HCL 5 MG/5ML IJ SOLN
INTRAMUSCULAR | Status: DC | PRN
  Administered 2017-12-25: 2 mg via INTRAVENOUS

## 2017-12-25 MED ORDER — MIDAZOLAM HCL 2 MG/2ML IJ SOLN
INTRAMUSCULAR | Status: AC
  Filled 2017-12-25: qty 2

## 2017-12-25 MED ORDER — FENTANYL CITRATE (PF) 250 MCG/5ML IJ SOLN
INTRAMUSCULAR | Status: AC
  Filled 2017-12-25: qty 5

## 2017-12-25 MED ORDER — ONDANSETRON HCL 4 MG/2ML IJ SOLN
INTRAMUSCULAR | Status: DC | PRN
  Administered 2017-12-25: 4 mg via INTRAVENOUS

## 2017-12-25 MED ORDER — HYDROMORPHONE HCL 1 MG/ML IJ SOLN
INTRAMUSCULAR | Status: AC
  Filled 2017-12-25: qty 1

## 2017-12-25 MED ORDER — CEFAZOLIN SODIUM-DEXTROSE 2-4 GM/100ML-% IV SOLN
INTRAVENOUS | Status: AC
  Filled 2017-12-25: qty 100

## 2017-12-25 MED ORDER — METHOCARBAMOL 500 MG PO TABS
500.0000 mg | ORAL_TABLET | Freq: Four times a day (QID) | ORAL | Status: DC | PRN
  Administered 2017-12-25: 500 mg via ORAL
  Filled 2017-12-25: qty 1

## 2017-12-25 MED ORDER — OXYCODONE HCL 5 MG PO TABS
10.0000 mg | ORAL_TABLET | ORAL | Status: DC | PRN
  Administered 2017-12-26: 10 mg via ORAL
  Administered 2017-12-26: 15 mg via ORAL
  Filled 2017-12-25 (×2): qty 3

## 2017-12-25 MED ORDER — HYDROMORPHONE HCL 1 MG/ML IJ SOLN
0.2500 mg | INTRAMUSCULAR | Status: DC | PRN
  Administered 2017-12-25 (×2): 0.5 mg via INTRAVENOUS

## 2017-12-25 MED ORDER — ONDANSETRON HCL 4 MG/2ML IJ SOLN
4.0000 mg | Freq: Four times a day (QID) | INTRAMUSCULAR | Status: DC | PRN
  Administered 2017-12-26: 4 mg via INTRAVENOUS
  Filled 2017-12-25: qty 2

## 2017-12-25 MED ORDER — LACTATED RINGERS IV SOLN
INTRAVENOUS | Status: DC
  Administered 2017-12-25 (×2): via INTRAVENOUS

## 2017-12-25 MED ORDER — ACETAMINOPHEN 325 MG PO TABS: 325.0000 mg | ORAL_TABLET | Freq: Four times a day (QID) | ORAL | Status: DC | PRN

## 2017-12-25 MED ORDER — METHOCARBAMOL 1000 MG/10ML IJ SOLN
500.0000 mg | Freq: Four times a day (QID) | INTRAVENOUS | Status: DC | PRN
  Filled 2017-12-25: qty 5

## 2017-12-25 MED ORDER — FENTANYL CITRATE (PF) 100 MCG/2ML IJ SOLN
INTRAMUSCULAR | Status: AC
  Filled 2017-12-25: qty 2

## 2017-12-25 MED ORDER — PROPOFOL 10 MG/ML IV BOLUS
INTRAVENOUS | Status: DC | PRN
  Administered 2017-12-25: 100 mg via INTRAVENOUS

## 2017-12-25 MED ORDER — PROPOFOL 10 MG/ML IV BOLUS
INTRAVENOUS | Status: AC
  Filled 2017-12-25: qty 20

## 2017-12-25 MED ORDER — CEFAZOLIN SODIUM-DEXTROSE 2-4 GM/100ML-% IV SOLN
2.0000 g | INTRAVENOUS | Status: AC
  Administered 2017-12-25: 2 g via INTRAVENOUS

## 2017-12-25 MED ORDER — MEPERIDINE HCL 50 MG/ML IJ SOLN: 6.2500 mg | INTRAMUSCULAR | Status: DC | PRN

## 2017-12-25 MED ORDER — GABAPENTIN 300 MG PO CAPS
300.0000 mg | ORAL_CAPSULE | Freq: Three times a day (TID) | ORAL | Status: DC
  Administered 2017-12-25 – 2017-12-29 (×13): 300 mg via ORAL
  Filled 2017-12-25 (×12): qty 1

## 2017-12-25 MED ORDER — HYDROMORPHONE HCL 1 MG/ML IJ SOLN
0.5000 mg | INTRAMUSCULAR | Status: DC | PRN
  Administered 2017-12-25 (×2): 1 mg via INTRAVENOUS
  Filled 2017-12-25 (×2): qty 1

## 2017-12-25 MED ORDER — METOCLOPRAMIDE HCL 5 MG/ML IJ SOLN: 5.0000 mg | Freq: Three times a day (TID) | INTRAMUSCULAR | Status: DC | PRN

## 2017-12-25 MED ORDER — ONDANSETRON HCL 4 MG/2ML IJ SOLN: 4.0000 mg | Freq: Once | INTRAMUSCULAR | Status: DC | PRN

## 2017-12-25 MED ORDER — DOCUSATE SODIUM 100 MG PO CAPS
100.0000 mg | ORAL_CAPSULE | Freq: Two times a day (BID) | ORAL | Status: DC
  Administered 2017-12-25 – 2017-12-26 (×2): 100 mg via ORAL
  Filled 2017-12-25 (×2): qty 1

## 2017-12-25 MED ORDER — MAGNESIUM CITRATE PO SOLN: 1.0000 | Freq: Once | ORAL | Status: DC | PRN

## 2017-12-25 MED ORDER — ONDANSETRON HCL 4 MG PO TABS: 4.0000 mg | ORAL_TABLET | Freq: Four times a day (QID) | ORAL | Status: DC | PRN

## 2017-12-25 MED ORDER — BISACODYL 10 MG RE SUPP: 10.0000 mg | Freq: Every day | RECTAL | Status: DC | PRN

## 2017-12-25 MED ORDER — METOCLOPRAMIDE HCL 5 MG PO TABS: 5.0000 mg | ORAL_TABLET | Freq: Three times a day (TID) | ORAL | Status: DC | PRN

## 2017-12-25 MED ORDER — SODIUM CHLORIDE 0.9 % IR SOLN
Status: DC | PRN
  Administered 2017-12-25: 1000 mL

## 2017-12-25 MED ORDER — SODIUM CHLORIDE 0.9 % IV SOLN: INTRAVENOUS | Status: DC

## 2017-12-25 MED ORDER — OXYCODONE HCL 5 MG PO TABS
5.0000 mg | ORAL_TABLET | ORAL | Status: DC | PRN
  Administered 2017-12-25 – 2017-12-28 (×6): 10 mg via ORAL
  Filled 2017-12-25 (×7): qty 2

## 2017-12-25 MED ORDER — POLYETHYLENE GLYCOL 3350 17 G PO PACK
17.0000 g | PACK | Freq: Every day | ORAL | Status: DC | PRN
Start: 2017-12-25 — End: 2017-12-29

## 2017-12-25 MED ORDER — LIDOCAINE HCL (CARDIAC) PF 100 MG/5ML IV SOSY
PREFILLED_SYRINGE | INTRAVENOUS | Status: DC | PRN
  Administered 2017-12-25: 100 mg via INTRAVENOUS

## 2017-12-25 MED ORDER — FENTANYL CITRATE (PF) 100 MCG/2ML IJ SOLN
INTRAMUSCULAR | Status: DC | PRN
  Administered 2017-12-25: 100 ug via INTRAVENOUS
  Administered 2017-12-25: 25 ug via INTRAVENOUS

## 2017-12-25 SURGICAL SUPPLY — 34 items
BENZOIN TINCTURE PRP APPL 2/3 (GAUZE/BANDAGES/DRESSINGS) ×8 IMPLANT
BLADE SAW RECIP 87.9 MT (BLADE) ×2 IMPLANT
BLADE SURG 21 STRL SS (BLADE) ×2 IMPLANT
BNDG COHESIVE 6X5 TAN STRL LF (GAUZE/BANDAGES/DRESSINGS) ×3 IMPLANT
COVER SURGICAL LIGHT HANDLE (MISCELLANEOUS) ×2 IMPLANT
CUFF TOURNIQUET SINGLE 34IN LL (TOURNIQUET CUFF) IMPLANT
CUFF TOURNIQUET SINGLE 44IN (TOURNIQUET CUFF) IMPLANT
DRAPE INCISE IOBAN 66X45 STRL (DRAPES) IMPLANT
DRAPE U-SHAPE 47X51 STRL (DRAPES) ×2 IMPLANT
DRESSING PREVENA PLUS CUSTOM (GAUZE/BANDAGES/DRESSINGS) ×1 IMPLANT
DRSG PREVENA PLUS CUSTOM (GAUZE/BANDAGES/DRESSINGS) ×2
DURAPREP 26ML APPLICATOR (WOUND CARE) ×2 IMPLANT
ELECT REM PT RETURN 9FT ADLT (ELECTROSURGICAL) ×2
ELECTRODE REM PT RTRN 9FT ADLT (ELECTROSURGICAL) ×1 IMPLANT
GLOVE BIOGEL PI IND STRL 9 (GLOVE) ×1 IMPLANT
GLOVE BIOGEL PI INDICATOR 9 (GLOVE) ×1
GLOVE SURG ORTHO 9.0 STRL STRW (GLOVE) ×2 IMPLANT
GOWN STRL REUS W/ TWL XL LVL3 (GOWN DISPOSABLE) ×2 IMPLANT
GOWN STRL REUS W/TWL XL LVL3 (GOWN DISPOSABLE) ×2
KIT BASIN OR (CUSTOM PROCEDURE TRAY) ×2 IMPLANT
KIT TURNOVER KIT B (KITS) ×2 IMPLANT
MANIFOLD NEPTUNE II (INSTRUMENTS) ×2 IMPLANT
NS IRRIG 1000ML POUR BTL (IV SOLUTION) ×2 IMPLANT
PACK ORTHO EXTREMITY (CUSTOM PROCEDURE TRAY) ×2 IMPLANT
PAD ARMBOARD 7.5X6 YLW CONV (MISCELLANEOUS) ×2 IMPLANT
SPONGE LAP 18X18 X RAY DECT (DISPOSABLE) IMPLANT
STAPLER VISISTAT 35W (STAPLE) IMPLANT
STOCKINETTE IMPERVIOUS LG (DRAPES) ×2 IMPLANT
SUT SILK 2 0 (SUTURE) ×1
SUT SILK 2-0 18XBRD TIE 12 (SUTURE) ×1 IMPLANT
SUT VIC AB 1 CTX 27 (SUTURE) IMPLANT
TOWEL OR 17X26 10 PK STRL BLUE (TOWEL DISPOSABLE) ×2 IMPLANT
TUBE CONNECTING 12X1/4 (SUCTIONS) ×2 IMPLANT
YANKAUER SUCT BULB TIP NO VENT (SUCTIONS) ×2 IMPLANT

## 2017-12-25 NOTE — Progress Notes (Signed)
Orthopedic Tech Progress Note Patient Details:  Clarisse GougeMichael Mcgurn 02/16/1951 [id number]  Patient ID: Clarisse GougeMichael Fitzwater, male   DOB: 11/30/1950, 67 y.o.   MRN: [id number]   Saul FordyceJennifer C Taimane Stimmel 12/25/2017, 4:51 PMCalled [employer] for Stump shrinker and limb protector.

## 2017-12-25 NOTE — Anesthesia Postprocedure Evaluation (Signed)
Anesthesia Post Note  Patient: Donald GougeMichael [name]  Procedure(s) Performed: LEFT BELOW KNEE AMPUTATION (Left )     Patient location during evaluation: PACU Anesthesia Type: General Level of consciousness: awake and alert Pain management: pain level controlled Vital Signs Assessment: post-procedure vital signs reviewed and stable Respiratory status: spontaneous breathing, nonlabored ventilation, respiratory function stable and patient connected to nasal cannula oxygen Cardiovascular status: blood pressure returned to baseline and stable Postop Assessment: no apparent nausea or vomiting Anesthetic complications: no    Last Vitals:  Vitals:   12/25/17 1000 12/25/17 1015  BP: 131/80 126/81  Pulse: 90 89  Resp: 16 12  Temp:    SpO2: 100% 100%    Last Pain:  Vitals:   12/25/17 1015  TempSrc:   PainSc: 8                  Jermal Dismuke DAVID

## 2017-12-25 NOTE — Plan of Care (Signed)
  Problem: Pain Managment: Goal: General experience of comfort will improve Outcome: Progressing   Problem: Skin Integrity: Goal: Risk for impaired skin integrity will decrease Outcome: Progressing   

## 2017-12-25 NOTE — Interval H&P Note (Signed)
History and Physical Interval Note:  12/25/2017 7:31 AM  Donald [name]  has presented today for surgery, with the diagnosis of LEFT FOOT GANGRENE  The various methods of treatment have been discussed with the patient and family. After consideration of risks, benefits and other options for treatment, the patient has consented to  Procedure(s): LEFT BELOW KNEE AMPUTATION (Left) as a surgical intervention .  The patient's history has been reviewed, patient examined, no change in status, stable for surgery.  I have reviewed the patient's chart and labs.  Questions were answered to the patient's satisfaction.     Nadara MustardMarcus V Duda

## 2017-12-25 NOTE — Op Note (Signed)
   Date of Surgery: 12/25/2017  INDICATIONS: Mr. Donald [name] is a 67 y.o.-year-old male who presents with gangrene left foot.  PREOPERATIVE DIAGNOSIS: gangrene left foot  POSTOPERATIVE DIAGNOSIS: Same.  PROCEDURE: Transtibial amputation Application of Prevena wound VAC  SURGEON: Lajoyce Cornersuda, M.D.  ANESTHESIA:  general  IV FLUIDS AND URINE: See anesthesia.  ESTIMATED BLOOD LOSS: min mL.  COMPLICATIONS: None.  DESCRIPTION OF PROCEDURE: The patient was brought to the operating room and underwent a general anesthetic. After adequate levels of anesthesia were obtained patient's lower extremity was prepped using DuraPrep draped into a sterile field. A timeout was called. The foot was draped out of the sterile field with impervious stockinette. A transverse incision was made 11 cm distal to the tibial tubercle. This curved proximally and a large posterior flap was created. The tibia was transected 1 cm proximal to the skin incision. The fibula was transected just proximal to the tibial incision. The tibia was beveled anteriorly. A large posterior flap was created. The sciatic nerve was pulled cut and allowed to retract. The vascular bundles were suture ligated with 2-0 silk. The deep and superficial fascial layers were closed using #1 Vicryl. The skin was closed using staples and 2-0 nylon. The wound was covered with a Prevena Restor wound VAC. There was a good suction fit. A prosthetic shrinker will be  applied. Patient was extubated taken to the PACU in stable condition.   DISCHARGE PLANNING:  Antibiotic duration:24 hours post op  Weightbearing: NWB left  Pain medication: opoid pathway oedered  Dressing care/ Wound VAC:vac for 2 weeks  Discharge to: SNF  Follow-up: In the office 1 week post operative.  Donald BakerMarcus Duda, MD [facility] 9:41 AM

## 2017-12-25 NOTE — Anesthesia Procedure Notes (Signed)
Procedure Name: LMA Insertion Date/Time: 12/25/2017 9:18 AM Performed by: Merdith Adan T, CRNA Pre-anesthesia Checklist: Patient identified, Emergency Drugs available, Suction available and Patient being monitored Patient Re-evaluated:Patient Re-evaluated prior to induction Oxygen Delivery Method: Circle system utilized Preoxygenation: Pre-oxygenation with 100% oxygen Induction Type: IV induction Ventilation: Mask ventilation without difficulty LMA: LMA inserted LMA Size: 4.0 Number of attempts: 1 Airway Equipment and Method: Patient positioned with wedge pillow Placement Confirmation: positive ETCO2 and breath sounds checked- equal and bilateral Tube secured with: Tape Dental Injury: Teeth and Oropharynx as per pre-operative assessment

## 2017-12-25 NOTE — Anesthesia Preprocedure Evaluation (Addendum)
Anesthesia Evaluation  Patient identified by MRN, date of birth, ID band Patient awake    Reviewed: Allergy & Precautions, NPO status , Patient's Chart, lab work & pertinent test results  Airway Mallampati: I  TM Distance: >3 FB Neck ROM: Full    Dental  (+) Poor Dentition, Dental Advisory Given   Pulmonary sleep apnea ,    Pulmonary exam normal        Cardiovascular hypertension, Pt. on medications Normal cardiovascular exam     Neuro/Psych    GI/Hepatic   Endo/Other  diabetes, Type 2, Insulin Dependent  Renal/GU      Musculoskeletal   Abdominal   Peds  Hematology   Anesthesia Other Findings   Reproductive/Obstetrics                            Anesthesia Physical Anesthesia Plan  ASA: III  Anesthesia Plan: General   Post-op Pain Management:    Induction: Intravenous  PONV Risk Score and Plan: 2 and Ondansetron and Treatment may vary due to age or medical condition  Airway Management Planned: LMA  Additional Equipment:   Intra-op Plan:   Post-operative Plan: Extubation in OR  Informed Consent: I have reviewed the patients History and Physical, chart, labs and discussed the procedure including the risks, benefits and alternatives for the proposed anesthesia with the patient or authorized representative who has indicated his/her understanding and acceptance.     Plan Discussed with: CRNA and Surgeon  Anesthesia Plan Comments:         Anesthesia Quick Evaluation

## 2017-12-25 NOTE — Progress Notes (Signed)
Pharmacy Antibiotic Note  Donald [name] is a 67 y.o. male admitted on 12/21/2017 with gangrenous foot.  Pharmacy has been consulted for Vancocin and cefepime dosing. S/p amputation today (12/25/17) and plan to discharge in 24 hours. WBC 28 (down from admission). Afebrile. SCr improved some at 1.31 with est CrCl ~45-50 mL/min.   Plan: Continue Vancomycin 1000mg  IV every 24 hours.  Goal trough 10-15 mcg/mL. Continue Cefepime 2g IV every 24 hours. Follow-up for LOT vs change to oral.   Height: 5\' 5"  (165.1 cm) Weight: 153 lb 14.1 oz (69.8 kg) IBW/kg (Calculated) : 61.5  Temp (24hrs), Avg:98.9 F (37.2 C), Min:97.7 F (36.5 C), Max:99.8 F (37.7 C)  Recent Labs  Lab 12/21/17 2258 12/21/17 2304 12/22/17 0133 12/22/17 0513 12/22/17 0753 12/24/17 0453 12/25/17 0534  WBC 35.9*  --   --   --  31.1* 28.1* 28.3*  CREATININE 1.52*  --   --  1.45*  --  1.35* 1.31*  LATICACIDVEN  --  2.61* 1.34  --   --   --   --     Estimated Creatinine Clearance: 47.6 mL/min (A) (by C-G formula based on SCr of 1.31 mg/dL (H)).    No Known Allergies   Thank you for allowing pharmacy to be a part of this patient's care. Link SnufferJessica Cara Thaxton, PharmD, BCPS, BCCCP Clinical Pharmacist Clinical phone 12/25/2017 until 3:30PM [phone] Please refer to [facility] for [facility] numbers 12/25/2017 11:17 AM

## 2017-12-25 NOTE — Care Management Note (Signed)
Case Management Note  Patient Details  Name: Clarisse GougeMichael Oros MRN: [id number] Date of Birth: 06/08/1950  Subjective/Objective:                    Action/Plan:  8/2  Transtibial amputation left foot Prevena wound VAC Await post op PT eval and MD orders Expected Discharge Date:                  Expected Discharge Plan:     In-House Referral:     Discharge planning Services  CM Consult  Post Acute Care Choice:  Durable Medical Equipment, Home Health Choice offered to:     DME Arranged:    DME Agency:     HH Arranged:    HH Agency:     Status of Service:  In process, will continue to follow  If discussed at Long Length of Stay Meetings, dates discussed:    Additional Comments:  Kingsley PlanWile, Vernor Monnig Marie, RN 12/25/2017, 11:31 AM

## 2017-12-25 NOTE — Progress Notes (Signed)
[facility] PROGRESS NOTE  Donald [name] ZOX:[id number] DOB: 1951/05/04 DOA: 12/21/2017 PCP: [name], PA-C   Narrative: 67 year old male-originally from [place] [moved here to be with family], DM TY 2+ neuropathy, HTN, normochromic normocytic anemia neck, probable daily drinker, BPH, hypogonadism, vitamin D deficiency, ocular hypertension followed by [facility] ophthalmology  admit 7/30 nausea chills malaise in a setting of stepping on a nail with his left foot 2 to 3 days prior Found to be febrile 39.2 tachycardia 110 blood pressure stable sinus tach 120 right axis deviation Pertinent labs glucose 364 sodium 127 creatinine 1.5 bili 2.2 WBC 35 hemoglobin 10 lactic acid 2.6 Orthopedics consulted-Vanco Zosyn Tdap-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-monitor sugars-cbg 1 50-1 70-continued lnagliptin and invokana [subsitutions for homemed]  BPH with hypogonadism-outpatient follow-up primary care  Ocular hypertension-follow-up with outpatient [facility] physician  Elevated bilirubin-suspect secondary to sepsis as is resolving  Mild metabolic acidosis, mild hyponatremia-monitor labs a.m.-placing on 75 cc NS for now given perioperative state  BPH-resumed home Terazosin  Dilutional normocytic anemia-stable  htn-holding lisinopril 5-bp tredns ok  H/o of hoarding and possible conversion disorder-per daughter house in unkempt and unsafe and she is removing his 7 gods from that locale    DVT prophylaxis: lovenox  Code Status: full   Family Communication: long discussion with daughter who is an Charity fundraiser at  Cancer center Disposition Plan: inpatient-will need skilled care-inform nursing regarding the same   Chenel Wernli, MD  [facility] Direct contact: [phone] --Via amion app OR  --[email or web address]; password [email or web address]  7PM-7AM contact night coverage as above 12/25/2017, 4:29 PM  LOS: 3 days   Consultants:  Dr Lajoyce Corners  Procedures:  No   Antimicrobials:  As abve  Interval history/Subjective:  Back from surgery earlier this morning doing fair, pain control, no fever no chills, no chest pain, no nausea  Objective:  Vitals:  Vitals:   12/25/17 1106 12/25/17 1433  BP: 116/82 111/75  Pulse: 90 86  Resp: 14 16  Temp: 98.9 F (37.2 C) 97.8 F (36.6 C)  SpO2: 99% 93%    Exam:  . Awake alert looks about stated age eomi ncat . mallampatti2 mod dentition . cta b . abd soft nt dn no rebound no guard . bno le edema . Amputation noted on left side with wound VAC in place   I have personally reviewed the following:   Labs:  Wbc 31->28--->28, hemoglobin  9.6-->8.7--8.8  Sodium down to 130 CO2 21 BUN/creatinine down from admission to 29/1.3 albumin 1.7 LFTs relatively normalized except mild variation in bilirubin 1.7  Imaging studies:  none  Medical tests:  no   Test discussed with performing physician:  none  Decision to obtain old records:  yea  Review and summation of old records:  yes  Scheduled Meds: . canagliflozin  100 mg Oral QAC breakfast  . docusate sodium  100 mg Oral BID  . dorzolamide  1 drop Both Eyes BID   And  . timolol  1 drop Both Eyes BID  . feeding supplement (GLUCERNA SHAKE)  237 mL Oral QHS  . gabapentin  300 mg Oral TID  . HYDROmorphone      .  insulin aspart  0-9 Units Subcutaneous Q4H  . linagliptin  5 mg Oral Daily  . multivitamin with minerals  1 tablet Oral Daily  . nutrition supplement (JUVEN)  1 packet Oral BID BM  . pneumococcal 23 valent vaccine  0.5 mL Intramuscular Tomorrow-1000  . pravastatin  40 mg Oral q1800  .  terazosin  1 mg Oral QHS   Continuous Infusions: . sodium chloride Stopped (12/25/17 1056)  . ceFEPime (MAXIPIME) IV 2 g (12/25/17 0449)  . lactated ringers Stopped (12/25/17 1055)  . methocarbamol (ROBAXIN) IV    . metronidazole Stopped (12/25/17 1450)  . vancomycin 1,000 mg (12/25/17 0031)    Principal Problem:   Gangrene of left foot (HCC) Active Problems:   Type II diabetes mellitus (HCC)   Essential hypertension   Renal insufficiency, mild   Hyponatremia   Hyperbilirubinemia   LOS: 3 days

## 2017-12-25 NOTE — Transfer of Care (Signed)
Immediate Anesthesia Transfer of Care Note  Patient: Donald GougeMichael Arras  Procedure(s) Performed: LEFT BELOW KNEE AMPUTATION (Left )  Patient Location: PACU  Anesthesia Type:General  Level of Consciousness: awake, alert  and oriented  Airway & Oxygen Therapy: Patient Spontanous Breathing and Patient connected to nasal cannula oxygen  Post-op Assessment: Report given to RN, Post -op Vital signs reviewed and stable and Patient moving all extremities  Post vital signs: Reviewed and stable  Last Vitals:  Vitals Value Taken Time  BP 115/78 12/25/2017  9:43 AM  Temp    Pulse 93 12/25/2017  9:45 AM  Resp 16 12/25/2017  9:45 AM  SpO2 99 % 12/25/2017  9:45 AM  Vitals shown include unvalidated device data.  Last Pain:  Vitals:   12/25/17 0448  TempSrc: Oral  PainSc:          Complications: No apparent anesthesia complications

## 2017-12-26 ENCOUNTER — Encounter (HOSPITAL_COMMUNITY): Payer: Self-pay | Admitting: Orthopedic Surgery

## 2017-12-26 LAB — CBC WITH DIFFERENTIAL/PLATELET
Basophils Absolute: 0 10*3/uL (ref 0.0–0.1)
Basophils Relative: 0 %
EOS ABS: 0.2 10*3/uL (ref 0.0–0.7)
Eosinophils Relative: 1 %
HCT: 24.7 % — ABNORMAL LOW (ref 39.0–52.0)
HEMOGLOBIN: 8.5 g/dL — AB (ref 13.0–17.0)
LYMPHS PCT: 9 %
Lymphs Abs: 1.6 10*3/uL (ref 0.7–4.0)
MCH: 31.4 pg (ref 26.0–34.0)
MCHC: 34.4 g/dL (ref 30.0–36.0)
MCV: 91.1 fL (ref 78.0–100.0)
MONOS PCT: 12 %
Monocytes Absolute: 2.1 10*3/uL — ABNORMAL HIGH (ref 0.1–1.0)
NEUTROS PCT: 78 %
Neutro Abs: 13.9 10*3/uL — ABNORMAL HIGH (ref 1.7–7.7)
PLATELETS: 435 10*3/uL — AB (ref 150–400)
RBC: 2.71 MIL/uL — AB (ref 4.22–5.81)
RDW: 12.9 % (ref 11.5–15.5)
WBC: 17.8 10*3/uL — AB (ref 4.0–10.5)

## 2017-12-26 LAB — GLUCOSE, CAPILLARY
GLUCOSE-CAPILLARY: 264 mg/dL — AB (ref 70–99)
GLUCOSE-CAPILLARY: 196 mg/dL — AB (ref 70–99)
GLUCOSE-CAPILLARY: 183 mg/dL — AB (ref 70–99)

## 2017-12-26 LAB — COMPREHENSIVE METABOLIC PANEL
ALT: 31 U/L (ref 0–44)
ANION GAP: 12 (ref 5–15)
AST: 54 U/L — ABNORMAL HIGH (ref 15–41)
Albumin: 1.7 g/dL — ABNORMAL LOW (ref 3.5–5.0)
Alkaline Phosphatase: 77 U/L (ref 38–126)
BILIRUBIN TOTAL: 1.2 mg/dL (ref 0.3–1.2)
BUN: 32 mg/dL — ABNORMAL HIGH (ref 8–23)
CO2: 23 mmol/L (ref 22–32)
Calcium: 8.2 mg/dL — ABNORMAL LOW (ref 8.9–10.3)
Chloride: 99 mmol/L (ref 98–111)
Creatinine, Ser: 1.12 mg/dL (ref 0.61–1.24)
GFR calc non Af Amer: >60 mL/min (ref >60)
Glucose, Bld: 249 mg/dL — ABNORMAL HIGH (ref 70–99)
POTASSIUM: 3.8 mmol/L (ref 3.5–5.1)
Sodium: 134 mmol/L — ABNORMAL LOW (ref 135–145)
TOTAL PROTEIN: 5.6 g/dL — AB (ref 6.5–8.1)

## 2017-12-26 MED ORDER — INSULIN ASPART 100 UNIT/ML ~~LOC~~ SOLN
0.0000 [IU] | Freq: Every day | SUBCUTANEOUS | Status: DC
  Administered 2017-12-28: 2 [IU] via SUBCUTANEOUS

## 2017-12-26 MED ORDER — INSULIN ASPART 100 UNIT/ML ~~LOC~~ SOLN
0.0000 [IU] | Freq: Three times a day (TID) | SUBCUTANEOUS | Status: DC
  Administered 2017-12-26: 2 [IU] via SUBCUTANEOUS
  Administered 2017-12-26: 3 [IU] via SUBCUTANEOUS
  Administered 2017-12-27: 1 [IU] via SUBCUTANEOUS
  Administered 2017-12-27 (×2): 2 [IU] via SUBCUTANEOUS
  Administered 2017-12-28: 1 [IU] via SUBCUTANEOUS
  Administered 2017-12-28: 2 [IU] via SUBCUTANEOUS
  Administered 2017-12-28: 1 [IU] via SUBCUTANEOUS
  Administered 2017-12-29 (×2): 2 [IU] via SUBCUTANEOUS

## 2017-12-26 MED ORDER — INSULIN ASPART 100 UNIT/ML ~~LOC~~ SOLN: 0.0000 [IU] | Freq: Three times a day (TID) | SUBCUTANEOUS | Status: DC

## 2017-12-26 NOTE — Evaluation (Addendum)
Physical Therapy Evaluation Patient Details Name: Donald [name] MRN: [id number] DOB: 08/27/1950 Today's Date: 12/26/2017   History of Present Illness  Pt is a 67 y.o. M with significant PMH of type 2 diabetes mellitus, peripheral neuropathy, and hypertension who initially presented with sepsis secondary to left foot gangrene. Now s/p left transtibial amputation.  Clinical Impression  Pt admitted with above diagnosis. Pt currently with functional limitations due to the deficits listed below (see PT Problem List). Patient independent at baseline with ADL's and mobility; enjoys walking his seven dogs. Currently, patient requiring up to moderate assistance for transferring from bed to chair with walker. Displays decreased functional mobility secondary to poor balance and functional strength deficits. Presenting with confusion and had forgotten he had surgery yesterday; unsure what patient's cognitive baseline is as no family/caregiver in the room. After discussion with RN, Aurea GraffJoan, she stated pt daughter is interested in discharge to CIR and then home with her. Highly recommending CIR to maximize functional independence and suspect patient will progress well based on PLOF and motivation. Pt will benefit from skilled PT to increase their independence and safety with mobility to allow discharge to the venue listed below.       Follow Up Recommendations CIR    Equipment Recommendations  Rolling walker with 5" wheels;Wheelchair (measurements PT);Wheelchair cushion (measurements PT);Other (comment)(amputee residual limb left legrest)    Recommendations for Other Services Rehab consult;OT consult     Precautions / Restrictions Precautions Precautions: Fall Required Braces or Orthoses: Other Brace/Splint Other Brace/Splint: limb guard Restrictions Weight Bearing Restrictions: Yes LLE Weight Bearing: Non weight bearing      Mobility  Bed Mobility Overal bed mobility: Needs Assistance Bed Mobility:  Supine to Sit     Supine to sit: Min guard     General bed mobility comments: use of bed rail   Transfers Overall transfer level: Needs assistance Equipment used: Rolling walker (2 wheeled) Transfers: Sit to/from UGI CorporationStand;Stand Pivot Transfers Sit to Stand: Min assist Stand pivot transfers: Mod assist       General transfer comment: Patient requiring min assist to boost up to stand with multimodal cues for hand placement and scooting forward to edge of bed. Increased time and instructions provided for both hands being placed on walker. Once standing and when performing stand pivot, patient requiring up to moderate assistance to steady due to lateral LOB. Able to perform one hop with decreased foot clearance.  Ambulation/Gait             General Gait Details: deferred  Stairs            Wheelchair Mobility    Modified Rankin (Stroke Patients Only)       Balance Overall balance assessment: Needs assistance Sitting-balance support: No upper extremity supported;Feet unsupported Sitting balance-Leahy Scale: Good     Standing balance support: Bilateral upper extremity supported Standing balance-Leahy Scale: Poor Standing balance comment: reliant on external support.                              Pertinent Vitals/Pain Pain Assessment: Faces Faces Pain Scale: No hurt    Home Living Family/patient expects to be discharged to:: Private residence Living Arrangements: Alone Available Help at Discharge: Family(daughter and son) Type of Home: House Home Access: Stairs to enter Entrance Stairs-Rails: None Entrance Stairs-Number of Steps: 3 Home Layout: One level Home Equipment: Shower seat      Prior Function Level of Independence: Independent  Hand Dominance        Extremity/Trunk Assessment   Upper Extremity Assessment Upper Extremity Assessment: Defer to OT evaluation    Lower Extremity Assessment Lower Extremity  Assessment: LLE deficits/detail LLE Deficits / Details: s/p L BKA. at least anti gravity strength       Communication   Communication: No difficulties  Cognition Arousal/Alertness: Awake/alert Behavior During Therapy: WFL for tasks assessed/performed Overall Cognitive Status: No family/caregiver present to determine baseline cognitive functioning Area of Impairment: Problem solving;Safety/judgement;Awareness;Memory                     Memory: Decreased recall of precautions;Decreased short-term memory   Safety/Judgement: Decreased awareness of safety;Decreased awareness of deficits Awareness: Emergent Problem Solving: Difficulty sequencing;Requires verbal cues General Comments: Patient A&Ox3 however does present with confusion. He did not remember having his surgery. Also with decreased safety awareness, initially very reluctant to use walker, calling it an "old person's wheelchair."       General Comments General comments (skin integrity, edema, etc.): wound vac in place    Exercises Amputee Exercises Quad Sets: 5 reps;Left;Seated   Assessment/Plan    PT Assessment Patient needs continued PT services  PT Problem List Decreased strength;Decreased activity tolerance;Decreased balance;Decreased mobility;Decreased cognition;Decreased safety awareness       PT Treatment Interventions DME instruction;Gait training;Stair training;Functional mobility training;Therapeutic activities;Therapeutic exercise;Balance training;Patient/family education    PT Goals (Current goals can be found in the Care Plan section)  Acute Rehab PT Goals Patient Stated Goal: walk without a walker PT Goal Formulation: With patient Time For Goal Achievement: 01/09/18 Potential to Achieve Goals: Good    Frequency Min 3X/week   Barriers to discharge        Co-evaluation               AM-PAC PT "6 Clicks" Daily Activity  Outcome Measure Difficulty turning over in bed (including  adjusting bedclothes, sheets and blankets)?: None Difficulty moving from lying on back to sitting on the side of the bed? : A Little Difficulty sitting down on and standing up from a chair with arms (e.g., wheelchair, bedside commode, etc,.)?: Unable Help needed moving to and from a bed to chair (including a wheelchair)?: A Lot Help needed walking in hospital room?: A Lot Help needed climbing 3-5 steps with a railing? : Total 6 Click Score: 13    End of Session Equipment Utilized During Treatment: Gait belt;Other (comment)(left limb protector) Activity Tolerance: Patient tolerated treatment well Patient left: in chair;with call bell/phone within reach;with chair alarm set Nurse Communication: Mobility status PT Visit Diagnosis: Unsteadiness on feet (R26.81);Other abnormalities of gait and mobility (R26.89);Difficulty in walking, not elsewhere classified (R26.2)    Time: 1610-9604 PT Time Calculation (min) (ACUTE ONLY): 27 min   Charges:   PT Evaluation $PT Eval Low Complexity: 1 Low PT Treatments $Therapeutic Activity: 8-22 mins        Laurina Bustle, PT, DPT Acute Rehabilitation Services  Pager: [phone]   Vanetta Mulders 12/26/2017, 2:54 PM

## 2017-12-26 NOTE — Progress Notes (Signed)
Inpatient Rehabilitation  Per PT request, patient was screened by Roena Sassaman for appropriateness for an Inpatient Acute Rehab consult.  At this time we are recommending an Inpatient Rehab consult.  Please order if you are agreeable.    Alberto Schoch, M.A., CCC/SLP Admission Coordinator  [facility]  Cell [phone]  

## 2017-12-26 NOTE — Progress Notes (Signed)
[facility] PRO[facility]ESS NOTE  Clarisse GougeMichael Conyer ZOX:[id number] DOB: 03/21/1951 DOA: 12/21/2017 PCP: Claybon JabsBrown, Emily, PA-C   Narrative: 67 year old male-[place] [moved here to be with family], DM TY 2+ neuropathy, HTN, normochromic normocytic anemia neck, probable daily drinker, BPH, hypogonadism, vitamin D deficiency, ocular [facility]  admit 7/30 nausea chills malaise in a setting of stepping on a nail with his left foot 2 to 3 days prior Found to be febrile 39.2 tachycardia 110 blood pressure stable sinus tach 120 right axis deviation Pertinent labs glucose 364 sodium 127 creatinine 1.5 bili 2.2 WBC 35 hemoglobin 10 lactic acid 2.6 Orthopedics consulted-Vanco Zosyn Tdap-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-monitor sugars-cbg 179-264-continued lnagliptin and invokana [subsitutions for homemed]  BPH with hypogonadism-outpatient follow-up primary care  Ocular hypertension-[facility]  Elevated bilirubin-suspect secondary to sepsis as is resolving  Mild metabolic acidosis, mild hyponatremia-monitor labs a.m.-placing on 75 cc NS for now given perioperative state  BPH-resumed home Terazosin  Dilutional normocytic anemia-stable in the 8 range  htn-holding lisinopril 5-bp tredns ok  H/o of hoarding and possible conversion disorder-per daughter house in unkempt and unsafe and she is removing his 7 gods  from that locale    DVT prophylaxis: lovenox  Code Status: full   Family Communication: long discussion with son at bedside disposition Plan: inpatient-will need skilled care-inform nursing regarding the same   Mahala MenghiniSamtani, MD  [facility] Direct contact: [phone] --Via amion app OR  --[email or web address]; password [email or web address]  7PM-7AM contact night coverage as above 12/26/2017, 10:32 AM  LOS: 4 days   Consultants:  Dr Lajoyce Cornersduda  Procedures:  No   Antimicrobials:  As abve  Interval history/Subjective:   Eating drinking No fever no chills pain is manageable  Objective:  Vitals:  Vitals:   12/26/17 0034 12/26/17 0500  BP: 102/67 119/70  Pulse: 91 90  Resp: 20 20  Temp: 98 F (36.7 C) 97.9 F (36.6 C)  SpO2: 95% 96%    Exam:  . Awake alert looks about stated age eomi ncat . mallampatti2 mod dentition . cta b . abd soft nt dn no rebound no guard . bno le edema . Device noted on left lower extremity   I have personally reviewed the following:   Labs:  Wbc 31->28--->28-->17.8, hemoglobin  9.6-->8.7--8.8-->8.5  Hemoglobin 8.5  Sodium 134 CO2 23 BUN/creatinine 31/1.1  Albumin 1.7 LFTs relatively normalized except mild variation in bilirubin 1.7  Imaging studies:  none  Medical tests:  no   Test discussed with performing physician:  none  Decision to obtain old records:  yea  Review and summation of old records:  yes  Scheduled Meds: . canagliflozin  100 mg Oral QAC breakfast  . docusate sodium  100 mg Oral BID  . dorzolamide  1 drop Both Eyes BID   And  . timolol  1 drop Both Eyes BID  . feeding supplement (GLUCERNA SHAKE)  237 mL Oral QHS  . gabapentin  300 mg Oral TID  . insulin aspart  0-5 Units Subcutaneous  QHS  . insulin aspart  0-9 Units Subcutaneous TID WC  . linagliptin  5 mg Oral Daily  . multivitamin with minerals  1 tablet Oral Daily  . nutrition supplement (JUVEN)  1 packet Oral BID BM  . pneumococcal 23 valent vaccine  0.5  mL Intramuscular Tomorrow-1000  . pravastatin  40 mg Oral q1800  . terazosin  1 mg Oral QHS   Continuous Infusions: . sodium chloride Stopped (12/25/17 1056)  . lactated ringers Stopped (12/25/17 1055)    Principal Problem:   Gangrene of left foot (HCC) Active Problems:   Type II diabetes mellitus (HCC)   Essential hypertension   Renal insufficiency, mild   Hyponatremia   Hyperbilirubinemia   LOS: 4 days

## 2017-12-26 NOTE — Progress Notes (Signed)
Patient ID: Donald GougeMichael Edds, male   DOB: 10/18/1950, 67 y.o.   MRN: [id number] Patient is postoperative day 1 left transtibial amputation.  Patient currently has the 2-week Praveena plus pump there is no drainage in the canister he has a stump shrinker in place provided by [employer].  Anticipate discharge to skilled nursing.

## 2017-12-27 DIAGNOSIS — Z89512 Acquired absence of left leg below knee: Secondary | ICD-10-CM

## 2017-12-27 LAB — BASIC METABOLIC PANEL
Anion gap: 7 (ref 5–15)
BUN: 24 mg/dL — AB (ref 8–23)
CHLORIDE: 102 mmol/L (ref 98–111)
CO2: 24 mmol/L (ref 22–32)
Calcium: 8.2 mg/dL — ABNORMAL LOW (ref 8.9–10.3)
Creatinine, Ser: 1.13 mg/dL (ref 0.61–1.24)
GFR calc Af Amer: >60 mL/min (ref >60)
GFR calc non Af Amer: >60 mL/min (ref >60)
GLUCOSE: 166 mg/dL — AB (ref 70–99)
POTASSIUM: 3.9 mmol/L (ref 3.5–5.1)
Sodium: 133 mmol/L — ABNORMAL LOW (ref 135–145)

## 2017-12-27 LAB — CULTURE, BLOOD (ROUTINE X 2)
CULTURE: NO GROWTH
Culture: NO GROWTH
Special Requests: ADEQUATE

## 2017-12-27 LAB — GLUCOSE, CAPILLARY
GLUCOSE-CAPILLARY: 196 mg/dL — AB (ref 70–99)
Glucose-Capillary: 149 mg/dL — ABNORMAL HIGH (ref 70–99)
Glucose-Capillary: 170 mg/dL — ABNORMAL HIGH (ref 70–99)
Glucose-Capillary: 182 mg/dL — ABNORMAL HIGH (ref 70–99)

## 2017-12-27 LAB — CBC
HEMATOCRIT: 25.5 % — AB (ref 39.0–52.0)
Hemoglobin: 8.3 g/dL — ABNORMAL LOW (ref 13.0–17.0)
MCH: 30.6 pg (ref 26.0–34.0)
MCHC: 32.5 g/dL (ref 30.0–36.0)
MCV: 94.1 fL (ref 78.0–100.0)
Platelets: 542 10*3/uL — ABNORMAL HIGH (ref 150–400)
RBC: 2.71 MIL/uL — AB (ref 4.22–5.81)
RDW: 13.2 % (ref 11.5–15.5)
WBC: 16.5 10*3/uL — ABNORMAL HIGH (ref 4.0–10.5)

## 2017-12-27 NOTE — Consult Note (Signed)
Physical Medicine and Rehabilitation Consult Reason for Consult:left BKA Referring Physician: Lajoyce Cornersuda   HPI: Donald GougeMichael Brannigan is a 67 y.o. male with a history of type 2 diabetes and peripheral artery disease.  Patient developed gangrene in his left foot, failing conservative measures and ultimately underwent a left below-knee amputation on 8/2 2019 per Dr. Lajoyce Cornersuda.  Vacuum placed to wound and stump shrinker in place.  Patient began mobilizing with physical therapy yesterday and demonstrated difficulty with functional mobility tasks.  PM&R was asked to consult to assess for postacute rehab needs   Review of Systems  Constitutional: Negative for fever.  HENT: Negative for hearing loss.   Eyes: Negative for double vision.  Respiratory: Negative for cough.   Cardiovascular: Negative for chest pain.  Gastrointestinal: Negative for nausea and vomiting.  Genitourinary: Negative for dysuria.  Musculoskeletal: Positive for joint pain and myalgias.  Skin: Negative for rash.  Neurological: Positive for sensory change and weakness.  Psychiatric/Behavioral: Negative for depression.   Past Medical History:  Diagnosis Date  . Diabetic peripheral neuropathy (HCC)   . Gangrene of left foot (HCC)   . High cholesterol   . Hypertension   . OSA on CPAP   . Type II diabetes mellitus (HCC)    Past Surgical History:  Procedure Laterality Date  . AMPUTATION Left 12/25/2017   Procedure: LEFT BELOW KNEE AMPUTATION;  Surgeon: Nadara Mustarduda, Marcus V, MD;  [facility];  Service: Orthopedics;  Laterality: Left;  . HEEL SPUR EXCISION Bilateral 1992  . ROTATOR CUFF REPAIR Left 1990s   History reviewed. No pertinent family history. Social History:  reports that he has never smoked. He has never used smokeless tobacco. He reports that he drank alcohol. He reports that he does not use drugs. Allergies: No Known Allergies Medications Prior to Admission  Medication Sig Dispense Refill  . aspirin EC 81 MG tablet  Take 81 mg by mouth daily.     . dorzolamide-timolol (COSOPT) 22.3-6.8 MG/ML ophthalmic solution Place 1 drop into both eyes 2 times daily.    . empagliflozin (JARDIANCE) 10 MG TABS tablet Take 10 mg by mouth daily.     Marland Kitchen. lisinopril (PRINIVIL,ZESTRIL) 5 MG tablet Take 5 mg by mouth daily.     . pravastatin (PRAVACHOL) 40 MG tablet Take 40 mg by mouth daily.     . sitaGLIPtin (JANUVIA) 100 MG tablet Take 100 mg by mouth daily.     Marland Kitchen. terazosin (HYTRIN) 1 MG capsule Take 1 mg by mouth at bedtime.     . Aflibercept 2 MG/0.05ML SOLN by Intravitreal route.      Home: Home Living Family/patient expects to be discharged to:: Private residence Living Arrangements: Alone Available Help at Discharge: Family(daughter and son) Type of Home: House Home Access: Stairs to enter Secretary/administratorntrance Stairs-Number of Steps: 3 Entrance Stairs-Rails: None Home Layout: One level Bathroom Shower/Tub: ArtistWalk-in shower Home Equipment: Bankerhower seat  Functional History: Prior Function Level of Independence: Independent Functional Status:  Mobility: Bed Mobility Overal bed mobility: Needs Assistance Bed Mobility: Supine to Sit Supine to sit: Min guard General bed mobility comments: use of bed rail  Transfers Overall transfer level: Needs assistance Equipment used: Rolling walker (2 wheeled) Transfers: Sit to/from Stand, [employer] Pet[employer] CorporationStand Pivot Transfers Sit to Stand: Min assist Stand pivot transfers: Mod assist General transfer comment: Patient requiring min assist to boost up to stand with multimodal cues for hand placement and scooting forward to edge of bed. Increased time and instructions provided for both  hands being placed on walker. Once standing and when performing stand pivot, patient requiring up to moderate assistance to steady due to lateral LOB. Able to perform one hop with decreased foot clearance. Ambulation/Gait General Gait Details: deferred    ADL:    Cognition: Cognition Overall Cognitive Status: No  family/caregiver present to determine baseline cognitive functioning Orientation Level: Oriented X4 Cognition Arousal/Alertness: Awake/alert Behavior During Therapy: WFL for tasks assessed/performed Overall Cognitive Status: No family/caregiver present to determine baseline cognitive functioning Area of Impairment: Problem solving, Safety/judgement, Awareness, Memory Memory: Decreased recall of precautions, Decreased short-term memory Safety/Judgement: Decreased awareness of safety, Decreased awareness of deficits Awareness: Emergent Problem Solving: Difficulty sequencing, Requires verbal cues General Comments: Patient A&Ox3 however does present with confusion. He did not remember having his surgery. Also with decreased safety awareness, initially very reluctant to use walker, calling it an "old person's wheelchair."   Blood pressure 124/70, pulse 86, temperature 98.2 F (36.8 C), temperature source Oral, resp. rate 18, height 5\' 5"  (1.651 m), weight 69.8 kg (153 lb 14.1 oz), SpO2 96 %. Physical Exam  Constitutional: He is oriented to person, place, and time. No distress.  HENT:  Head: Normocephalic.  Eyes:  Poor dentition  Neck: Normal range of motion.  Cardiovascular: Normal rate.  Respiratory: Effort normal.  GI: Soft.  Musculoskeletal:  Left leg in KI  Neurological: He is alert and oriented to person, place, and time. No cranial nerve deficit.  UE 5/5. RLE 4/5 prox to distal. Able to lift LLE off the bed. Stocking glove sensory loss RLE to mid calf.  Skin: He is not diaphoretic.  Left BKA with vac in place/sealed  Psychiatric: He has a normal mood and affect. His behavior is normal.    Results for orders placed or performed during the hospital encounter of 12/21/17 (from the past 24 hour(s))  Glucose, capillary     Status: Abnormal   Collection Time: 12/26/17  5:27 PM  Result Value Ref Range   Glucose-Capillary 196 (H) 70 - 99 mg/dL  Glucose, capillary     Status: Abnormal     Collection Time: 12/26/17  9:49 PM  Result Value Ref Range   Glucose-Capillary 183 (H) 70 - 99 mg/dL  CBC     Status: Abnormal   Collection Time: 12/27/17  7:22 AM  Result Value Ref Range   WBC 16.5 (H) 4.0 - 10.5 K/uL   RBC 2.71 (L) 4.22 - 5.81 MIL/uL   Hemoglobin 8.3 (L) 13.0 - 17.0 g/dL   HCT 16.1 (L) 09.6 - 04.5 %   MCV 94.1 78.0 - 100.0 fL   MCH 30.6 26.0 - 34.0 pg   MCHC 32.5 30.0 - 36.0 g/dL   RDW 40.9 81.1 - 91.4 %   Platelets 542 (H) 150 - 400 K/uL  Glucose, capillary     Status: Abnormal   Collection Time: 12/27/17  8:03 AM  Result Value Ref Range   Glucose-Capillary 149 (H) 70 - 99 mg/dL   No results found.  Assessment/Plan: Diagnosis: gangrene LLE s/p left BKA 1. Does the need for close, 24 hr/day medical supervision in concert with the patient's rehab needs make it unreasonable for this patient to be served in a less intensive setting? Yes 2. Co-Morbidities requiring supervision/potential complications: DM2 with DPN, htn, wound care, pain mgt 3. Due to bladder management, bowel management, safety, skin/wound care, disease management, medication administration, pain management and patient education, does the patient require 24 hr/day rehab nursing? Yes 4. Does the patient  require coordinated care of a physician, rehab nurse, PT (1-2 hrs/day, 5 days/week) and OT (1-2 hrs/day, 5 days/week) to address physical and functional deficits in the context of the above medical diagnosis(es)? Yes Addressing deficits in the following areas: balance, endurance, locomotion, strength, transferring, bowel/bladder control, bathing, dressing, feeding, grooming, toileting and psychosocial support 5. Can the patient actively participate in an intensive therapy program of at least 3 hrs of therapy per day at least 5 days per week? Yes 6. The potential for patient to make measurable gains while on inpatient rehab is excellent 7. Anticipated functional outcomes upon discharge from inpatient  rehab are modified independent  with PT, modified independent with OT, n/a with SLP. 8. Estimated rehab length of stay to reach the above functional goals is: 8-12 days 9. Anticipated D/C setting: Home 10. Anticipated post D/C treatments: HH therapy and Outpatient therapy 11. Overall Rehab/Functional Prognosis: excellent  RECOMMENDATIONS: This patient's condition is appropriate for continued rehabilitative care in the following setting: CIR Patient has agreed to participate in recommended program. Yes Note that insurance prior authorization may be required for reimbursement for recommended care.  Comment: Rehab Admissions Coordinator to follow up.  Thanks,  Ranelle Oyster, MD, [place] [facility]  I have personally performed a face to face diagnostic evaluation of this patient. Additionally, I have reviewed and concur with the physician assistant's documentation above.    Ranelle Oyster, MD 12/27/2017

## 2017-12-27 NOTE — Progress Notes (Signed)
[facility] PROGRESS NOTE  Donald GougeMichael [name] ZOX:[id number] DOB: 10/17/1950 DOA: 12/21/2017 PCP: Claybon JabsBrown, Emily, PA-C   Narrative: 67 year old male-originally from [place] [moved here to be with family], DM TY 2+ neuropathy, HTN, normochromic normocytic anemia neck, probable daily drinker, BPH, hypogonadism, vitamin D deficiency, ocular hypertension followed by [facility]  admit 7/30 nausea chills malaise in a setting of stepping on a nail with his left foot 2 to 3 days prior Found to be febrile 39.2 tachycardia 110 blood pressure stable sinus tach 120 right axis deviation Pertinent labs glucose 364 sodium 127 creatinine 1.5 bili 2.2 WBC 35 hemoglobin 10 lactic acid 2.6 Orthopedics consulted-Vanco Zosyn Tdap-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  BPH-resumed home Terazosin  Dilutional normocytic anemia-stable in the 8 range transfusion threshold <7  htn-holding lisinopril 5-bp tredns ok  H/o of hoarding and possible conversion disorder-per daughter house in unkempt and unsafe and she is removing his 7 gods from that locale    DVT prophylaxis: lovenox  Code Status: full   Family  Communication: long discussion with son at bedside disposition Plan: inpatient-will need skilled care-inform nursing regarding the same   Jaymes Hang, MD  [facility] Direct contact: [phone] --Via amion app OR  --[email or web address]; password [email or web address]  7PM-7AM contact night coverage as above 12/27/2017, 11:23 AM  LOS: 5 days   Consultants:  Dr Lajoyce Cornersduda  Procedures:  No   Antimicrobials:  As abve  Interval history/Subjective: Overall improved no distress Chest pain no fever Mild nausea and one episode of vomiting yesterday however since then none No diarrhea currently as is off laxatives  encouraged patient to get out of bed and transfer  Objective:  Vitals:  Vitals:   12/26/17 2152 12/27/17 0445  BP: 127/71 124/70  Pulse: 93 86  Resp: 17 18  Temp: 98.6 F (37 C) 98.2 F (36.8 C)  SpO2: 99% 96%    Exam:  . Awake alert looks about stated age eomi ncat . mallampatti2 mod dentition-no distress . cta b . abd soft nt dn no rebound no guard . bno le edema-equipment on lower extremity as above . Device noted on left lower extremity . Neurologically is intact without deficit   I have personally reviewed the following:   Labs:  Wbc 31->28--->28-->17.8--16.5, hemoglobin  9.6-->8.7--8.8-->8.5--8.  Sodium 134 CO2 23--133/CO2 24   BUN/creatinine 31/1.1 >24/1.1   Imaging studies:  none  Medical tests:  no   Test discussed with performing physician:  none  Decision to obtain old records:  yea  Review and summation of old records:  yes  Scheduled Meds: . canagliflozin  100 mg Oral QAC breakfast  . dorzolamide  1 drop Both Eyes BID   And  . timolol  1 drop Both Eyes BID  . feeding supplement (GLUCERNA SHAKE)  237 mL Oral QHS  . gabapentin  300 mg Oral TID  . insulin  aspart  0-5 Units Subcutaneous QHS  . insulin aspart  0-9 Units Subcutaneous TID WC  . linagliptin  5 mg Oral Daily  . multivitamin with minerals  1 tablet Oral Daily  . nutrition  supplement (JUVEN)  1 packet Oral BID BM  . pneumococcal 23 valent vaccine  0.5 mL Intramuscular Tomorrow-1000  . pravastatin  40 mg Oral q1800  . terazosin  1 mg Oral QHS   Continuous Infusions: . lactated ringers Stopped (12/25/17 1055)    Principal Problem:   Gangrene of left foot (HCC) Active Problems:   Type II diabetes mellitus (HCC)   Essential hypertension   Renal insufficiency, mild   Hyponatremia   Hyperbilirubinemia   LOS: 5 days

## 2017-12-27 NOTE — Progress Notes (Signed)
Patient ID: Donald GougeMichael Piedra, male   DOB: 12/14/1950, 67 y.o.   MRN: [id number] Postoperative day 2 left transtibial amputation.  The wound VAC is functioning well no drainage.  Patient will continue with the portable wound VAC pump for 2 weeks.

## 2017-12-28 DIAGNOSIS — G8918 Other acute postprocedural pain: Secondary | ICD-10-CM

## 2017-12-28 DIAGNOSIS — S88112A Complete traumatic amputation at level between knee and ankle, left lower leg, initial encounter: Secondary | ICD-10-CM

## 2017-12-28 LAB — GLUCOSE, CAPILLARY
GLUCOSE-CAPILLARY: 175 mg/dL — AB (ref 70–99)
GLUCOSE-CAPILLARY: 202 mg/dL — AB (ref 70–99)
Glucose-Capillary: 227 mg/dL — ABNORMAL HIGH (ref 70–99)
Glucose-Capillary: 151 mg/dL — ABNORMAL HIGH (ref 70–99)
Glucose-Capillary: 149 mg/dL — ABNORMAL HIGH (ref 70–99)

## 2017-12-28 MED ORDER — GLUCERNA SHAKE PO LIQD: 237.0000 mL | Freq: Every day | ORAL | 0 refills | Status: AC

## 2017-12-28 MED ORDER — GABAPENTIN 300 MG PO CAPS: 300.0000 mg | ORAL_CAPSULE | Freq: Three times a day (TID) | ORAL | 0 refills | Status: AC

## 2017-12-28 MED ORDER — OXYCODONE HCL 10 MG PO TABS: 10.0000 mg | ORAL_TABLET | ORAL | 0 refills | Status: AC | PRN

## 2017-12-28 MED ORDER — ACETAMINOPHEN 325 MG PO TABS: 325.0000 mg | ORAL_TABLET | Freq: Four times a day (QID) | ORAL | Status: AC | PRN

## 2017-12-28 NOTE — Discharge Summary (Signed)
Physician Discharge Summary  [name] ZOX:[id number] DOB: 30-Jan-1951 DOA: 12/21/2017  PCP: [name], PA-C  Admit date: 12/21/2017 Discharge date: 12/28/2017  Time spent: 35 minutes  Recommendations for Outpatient Follow-up:  1. Needs CBC, Cmet 2. Dr Lajoyce Corners will need to follow in 2 weeks-keep pump on until seen by Dr. Lajoyce Corners 3. rx given for opiates, gabapentin  Discharge Diagnoses:  Principal Problem:   Gangrene of left foot [facility]) Active Problems:   Type II diabetes mellitus (HCC)   Essential hypertension   Renal insufficiency, mild   Hyponatremia   Hyperbilirubinemia   Discharge Condition: improved  Diet recommendation: diabetic, htn  Filed Weights   07/Donald/19 0814 12/23/17 0504 12/25/17 0722  Weight: 66.5 kg (146 lb 9.7 oz) 69.8 kg (153 lb 14.1 oz) 69.8 kg (153 lb 14.1 oz)    History of present illness:   67 year old Donald [name]-originally from [place] [moved here to be with family], DM TY 2+ neuropathy, HTN, normochromic normocytic anemia neck, probable daily drinker, BPH, hypogonadism, vitamin D deficiency, ocular hypertension followed by [facility] ophthalmology admit 7/Donald nausea chills malaise in a setting of stepping on a nail with his left foot 2 to 3 days prior Found to be febrile 39.2 tachycardia 110 blood pressure stable sinus tach 120 right axis deviation Pertinent labs glucose 364 sodium 127 creatinine 1.5 bili 2.2 WBC 35 hemoglobin 10 lactic acid 2.6 Orthopedics consulted-Vanco Zosyn Tdap-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-monitor sugars-cbg 151-196 -continued lnagliptin and invokana [subsitutions for  homemed]  BPH with hypogonadism-outpatient follow-up primary care  Ocular hypertension-follow-up with outpatient [facility] physician  Elevated bilirubin-suspect secondary to sepsis as is resolving  Mild metabolic acidosis, mild hyponatremia-monitor labs a.m.-labs 3-4 days  BPH-resumed home Terazosin  Dilutional normocytic anemia-stable in the 8 range transfusion threshold <7  htn-resumed lisinopril on d/c  H/o of hoarding and possible conversion disorder-per daughter house in unkempt and unsafe and she is removing his 7 gods from that locale  Procedures:  L transtibial amputation   Consultations:   ortho  Discharge Exam: Vitals:   12/27/17 1934 12/28/17 0500  BP: 120/72 136/82  Pulse: 85 88  Resp:  20  Temp: 98.3 F (36.8 C) 98.9 F (37.2 C)  SpO2: 96% 99%    General: awake alert stump healed and in device and vac Cardiovascular: s1 s 2no m Respiratory: cta b  Discharge Instructions   Discharge Instructions    Diet - low sodium heart healthy   Complete by:  As directed    Increase activity slowly   Complete by:  As directed    Negative Pressure Wound Therapy - Incisional   Complete by:  As directed    Continue for total of 2 weeks     Allergies as of 12/28/2017   No Known Allergies     Medication List    TAKE these medications   acetaminophen 325 MG tablet Commonly known as:  TYLENOL Take 1-2 tablets (325-650 mg total) by mouth every 6 (six) hours as needed for mild pain (pain score 1-3 or temp > 100.5).   Aflibercept 2 MG/0.05ML Soln by Intravitreal route.   aspirin EC 81 MG tablet Take 81 mg by mouth daily.  dorzolamide-timolol 22.3-6.8 MG/ML ophthalmic solution Commonly known as:  COSOPT Place 1 drop into both eyes 2 times daily.   empagliflozin 10 MG Tabs tablet Commonly known as:  JARDIANCE Take 10 mg by mouth daily.   feeding supplement (GLUCERNA SHAKE) Liqd Take 237 mLs by mouth at bedtime.   gabapentin 300 MG  capsule Commonly known as:  NEURONTIN Take 1 capsule (300 mg total) by mouth 3 (three) times daily.   lisinopril 5 MG tablet Commonly known as:  PRINIVIL,ZESTRIL Take 5 mg by mouth daily.   Oxycodone HCl 10 MG Tabs Take 1-1.5 tablets (10-15 mg total) by mouth every 4 (four) hours as needed for severe pain (pain score 7-10).   pravastatin 40 MG tablet Commonly known as:  PRAVACHOL Take 40 mg by mouth daily.   sitaGLIPtin 100 MG tablet Commonly known as:  JANUVIA Take 100 mg by mouth daily.   terazosin 1 MG capsule Commonly known as:  HYTRIN Take 1 mg by mouth at bedtime.      No Known Allergies Follow-up Information    Nadara Mustard, MD In 1 week.   Specialty:  Orthopedic Surgery Contact information: [address] [place] [address] [phone]            The results of significant diagnostics from this hospitalization (including imaging, microbiology, ancillary and laboratory) are listed below for reference.    Significant Diagnostic Studies: Dg Chest 2 View  Result Date: 12/21/2017 CLINICAL DATA:  Chills, fever and weakness. EXAM: CHEST - 2 VIEW COMPARISON:  None. FINDINGS: Cardiac silhouette is upper limits of normal size. Mediastinal silhouette is not suspicious. No pleural effusion or focal consolidation. Mild bronchitic changes. No pneumothorax. Soft tissue planes and included osseous structures are non suspicious. Lower thoracic dextroscoliosis could be positional. Chronic fragmentation RIGHT AC joint. IMPRESSION: 1. Borderline cardiomegaly. 2. Bronchitic changes without focal consolidation. Electronically Signed   By: Awilda Metro M.D.   On: 12/21/2017 23:23   Ct Head Wo Contrast  Result Date: 12/21/2017 CLINICAL DATA:  Fevers, chills, nausea/vomiting EXAM: CT HEAD WITHOUT CONTRAST TECHNIQUE: Contiguous axial images were obtained from the base of the skull through the vertex without intravenous contrast. COMPARISON:  MRI brain dated  10/30/2017 FINDINGS: Brain: No evidence of acute infarction, hemorrhage, hydrocephalus, extra-axial collection or mass lesion/mass effect. Mild cortical atrophy. Mild subcortical white matter and periventricular small vessel ischemic changes. Vascular: Intracranial atherosclerosis. Skull: Normal. Negative for fracture or focal lesion. Sinuses/Orbits: The visualized paranasal sinuses are essentially clear. The mastoid air cells are unopacified. Other: None. IMPRESSION: No evidence of acute intracranial abnormality. Mild atrophy with small vessel ischemic changes. Electronically Signed   By: Charline Bills M.D.   On: 12/21/2017 23:57   Dg Foot Complete Left  Result Date: 7/Donald/2019 CLINICAL DATA:  Gangrenous left foot EXAM: LEFT FOOT - COMPLETE 3+ VIEW COMPARISON:  None. FINDINGS: Mottled lucencies/soft tissue gas throughout the left forefoot, overlying the 1st through 4th digits, extending along the dorsal and plantar surfaces. No definite underlying cortical destruction, although osseous evaluation is obscured due to overlying lucencies. No radiopaque foreign body is seen. IMPRESSION: Extensive soft tissue gas throughout the left forefoot, overlying the dorsal and plantar surfaces of the 1st through 4th digits, compatible with the clinical history of soft tissue gangrene. No definite radiographic findings of underlying osseous destruction, noting limited evaluation. Electronically Signed   By: Charline Bills M.D.   On: 07/Donald/2019 00:16    Microbiology: Recent Results (from the past 240 hour(s))  Culture, blood (Routine x 2)     Status: None   Collection Time: 12/21/17 10:58 PM  Result Value Ref Range Status   Specimen Description   Final    BLOOD RIGHT HAND Performed at [facility], [address] [place], [place] [address]    Special Requests   Final    BOTTLES DRAWN AEROBIC AND ANAEROBIC Blood Culture adequate volume Performed at [facility] [place], [address]  [place], [place] [address]    Culture   Final    NO GROWTH 5 DAYS Performed at [facility], [address] [place], [place] [address]    Report Status 12/27/2017 FINAL  Final  Culture, blood (Routine x 2)     Status: None   Collection Time: 12/21/17 11:00 PM  Result Value Ref Range Status   Specimen Description   Final    BLOOD LEFT FOREARM Performed at [facility] [place], [address] [place], [place] [address]    Special Requests   Final    BOTTLES DRAWN AEROBIC ONLY Blood Culture results may not be optimal due to an inadequate volume of blood received in culture bottles Performed at [facility] [place], [address] [place], [place] [address]    Culture   Final    NO GROWTH 5 DAYS Performed at [facility], [address] [place], [place] [address]    Report Status 12/27/2017 FINAL  Final  Surgical pcr screen     Status: None   Collection Time: 12/24/17  6:32 AM  Result Value Ref Range Status   MRSA, PCR NEGATIVE NEGATIVE Final   Staphylococcus aureus NEGATIVE NEGATIVE Final    Comment: (NOTE) The Xpert SA Assay (FDA approved for NASAL specimens in patients 67 years of age and older), is one component of a comprehensive surveillance program. It is not intended to diagnose infection nor to guide or monitor treatment. Performed at [facility], [address] [place], [place] [address]      Labs: Basic Metabolic Panel: Recent Labs  Lab 07/Donald/19 [id number] 12/24/17 0453 12/25/17 0534 12/26/17 0555 12/27/17 0722  NA 133* 131* 130* 134* 133*  K 4.2 3.6 3.9 3.8 3.9  CL 100 98 100 99 102  CO2 23 22 21* 23 24  GLUCOSE 238* 231* 166* 249* 166*  BUN 26* 31* 29* 32* 24*  CREATININE 1.45* 1.35* 1.31* 1.12 1.13  CALCIUM 8.1* 8.2* 8.2* 8.2* 8.2*   Liver Function Tests: Recent Labs  Lab 12/21/17 2258 07/Donald/19 0513 12/24/17 0453 12/25/17 0534 12/26/17 0555  AST 38  --  17 59* 54*  ALT 32  --  17 29 31   ALKPHOS 105  --  93 110 77  BILITOT  2.2* 2.2* 1.2 1.7* 1.2  PROT 7.8  --  6.0* 5.9* 5.6*  ALBUMIN 2.7*  --  1.7* 1.7* 1.7*   No results for input(s): LIPASE, AMYLASE in the last 168 hours. No results for input(s): AMMONIA in the last 168 hours. CBC: Recent Labs  Lab 12/21/17 2258 07/Donald/19 0753 12/24/17 0453 12/25/17 0534 12/26/17 0555 12/27/17 0722  WBC 35.9* 31.1* 28.1* 28.3* 17.8* 16.5*  NEUTROABS 31.6*  --  23.9* 23.8* 13.9*  --   HGB 10.5* 9.6* 8.7* 8.8* 8.5* 8.3*  HCT 29.0* 27.4* 25.8* 25.9* 24.7* 25.5*  MCV 87.6 90.7 90.5 91.2 91.1 94.1  PLT 380 299 366 451* 435* 542*   Cardiac Enzymes: Recent Labs  Lab 12/21/17 2258  CKTOTAL 95   BNP: BNP (last 3 results) No results for input(s): BNP in the last 8760 hours.  ProBNP (last 3 results) No results for input(s): PROBNP in the last 8760 hours.  CBG: Recent Labs  Lab 12/27/17 0803 12/27/17 1122 12/27/17 1723 12/27/17 2123 12/28/17 0809  GLUCAP 149* 170* 182* 196* 151*       Signed:  Rhetta Mura MD   [facility] 12/28/2017, 9:46 AM

## 2017-12-28 NOTE — Progress Notes (Signed)
Pt refusing juven but says he likes the ensure. Says the Heinz Knucklesjuven makes him sick

## 2017-12-28 NOTE — Evaluation (Addendum)
Occupational Therapy Evaluation Patient Details Name: Donald [name] MRN: [id number] DOB: [birth year] Today's Date: 12/28/2017    History of Present Illness Pt is a 67 y.o. M with significant PMH of type 2 diabetes mellitus, peripheral neuropathy, and hypertension who initially presented with sepsis secondary to left foot gangrene. Now s/p left transtibial amputation.   Clinical Impression   PTA patient independent with ADLs, IADLs and mobility.  Currently he requires setup assistance for UB ADL, min assist for LB ADL, min assist for toileting and min assist for toilet transfers using RW.  He presents with decreased safety awareness and problem solving, impaired balance, and decreased activity tolerance.  Educated on precautions, safety, mobility, and ADL compensatory techniques.  Encouraged patient to call for assistance and use 3:1 commode for toileting, instead of bedpan; patient continues to be resistant to idea. Patient with limited carryover of safety precautions, continuing to report "I can just hop in a car and I'll be fine".  Based on performance today, recommend CIR in order to maximize safety and independence prior to dc home. Will continue to follow while admitted.     Follow Up Recommendations  CIR;Supervision/Assistance - 24 hour    Equipment Recommendations  Other (comment)(TBD at next venue of care)    Recommendations for Other Services Rehab consult     Precautions / Restrictions Precautions Precautions: Fall;Other (comment) Precaution Comments: L BKA, wound vac Required Braces or Orthoses: Other Brace/Splint Other Brace/Splint: limb protector Restrictions Weight Bearing Restrictions: Yes LLE Weight Bearing: Non weight bearing      Mobility Bed Mobility Overal bed mobility: Needs Assistance Bed Mobility: Supine to Sit     Supine to sit: Supervision     General bed mobility comments: close supervision for safety   Transfers Overall transfer level: Needs  assistance Equipment used: Rolling walker (2 wheeled) Transfers: Sit to/from [employer] Sit to Stand: Min assist Stand pivot transfers: Min assist       General transfer comment: Min assist for safety and balance, cueing for hand placement, safety and technique.    Balance Overall balance assessment: Needs assistance Sitting-balance support: No upper extremity supported;Feet unsupported Sitting balance-Leahy Scale: Good     Standing balance support: Bilateral upper extremity supported;During functional activity Standing balance-Leahy Scale: Poor Standing balance comment: reliant on BUE support                           ADL either performed or assessed with clinical judgement   ADL Overall ADL's : Needs assistance/impaired Eating/Feeding: Supervision/ safety;Set up;Sitting   Grooming: Sitting;Set up   Upper Body Bathing: Set up;Sitting   Lower Body Bathing: Min guard;Sit to/from stand   Upper Body Dressing : Set up;Sitting   Lower Body Dressing: Minimal assistance;Sit to/from stand   Toilet Transfer: Minimal assistance;RW;Stand-pivot(simulated to recliner ) Toilet Transfer Details (indicate cue type and reason): requires cueing for safety and technique using rolling walker  Toileting- Clothing Manipulation and Hygiene: Minimal assistance;Sit to/from stand       Functional mobility during ADLs: Minimal assistance;Rolling walker;Cueing for safety General ADL Comments: In standing requires min guard to minA for balance/safety.  Patient with poor safety awareness and problem sovling.     Vision Baseline Vision/History: Wears glasses Wears Glasses: Reading only Patient Visual Report: No change from baseline Vision Assessment?: No apparent visual deficits     Perception     Praxis      Pertinent Vitals/Pain Pain Assessment: No/denies pain  Hand Dominance ("both" per pt report)   Extremity/Trunk Assessment Upper Extremity  Assessment Upper Extremity Assessment: Overall WFL for tasks assessed   Lower Extremity Assessment Lower Extremity Assessment: Defer to PT evaluation LLE Deficits / Details: s/p L BKA   Cervical / Trunk Assessment Cervical / Trunk Assessment: Normal   Communication Communication Communication: No difficulties   Cognition Arousal/Alertness: Awake/alert Behavior During Therapy: WFL for tasks assessed/performed Overall Cognitive Status: No family/caregiver present to determine baseline cognitive functioning Area of Impairment: Problem solving;Awareness;Safety/judgement;Memory                     Memory: Decreased recall of precautions   Safety/Judgement: Decreased awareness of safety;Decreased awareness of deficits Awareness: Emergent Problem Solving: Difficulty sequencing;Requires verbal cues General Comments: Patient oriented, but presents with decreased safety awareness and problem sovling.  Reporting throughout session that he doesn't trust the RW and he would do better if he could just walk on his own.    General Comments  educated on importance of exiting bed and mobility as L BKA is healing    Exercises     Shoulder Instructions      Home Living Family/patient expects to be discharged to:: Private residence Living Arrangements: Alone Available Help at Discharge: Family Type of Home: House Home Access: Stairs to enter Secretary/administrator of Steps: 3 Entrance Stairs-Rails: None Home Layout: One level     Bathroom Shower/Tub: Producer, television/film/video: Standard     Home Equipment: Shower seat          Prior Functioning/Environment Level of Independence: Independent        Comments: reports has 7 dogs         OT Problem List: Decreased strength;Impaired balance (sitting and/or standing);Decreased safety awareness;Decreased knowledge of use of DME or AE;Decreased knowledge of precautions      OT Treatment/Interventions: Self-care/ADL  training;Therapeutic exercise;DME and/or AE instruction;Energy conservation;Therapeutic activities;Patient/family education;Balance training    OT Goals(Current goals can be found in the care plan section) Acute Rehab OT Goals Patient Stated Goal: walk without a walker OT Goal Formulation: With patient Time For Goal Achievement: 01/04/18 Potential to Achieve Goals: Good  OT Frequency: Min 2X/week   Barriers to D/C:            Co-evaluation              AM-PAC PT "6 Clicks" Daily Activity     Outcome Measure Help from another person eating meals?: None Help from another person taking care of personal grooming?: A Little Help from another person toileting, which includes using toliet, bedpan, or urinal?: A Little Help from another person bathing (including washing, rinsing, drying)?: A Little Help from another person to put on and taking off regular upper body clothing?: None Help from another person to put on and taking off regular lower body clothing?: A Little 6 Click Score: 20   End of Session Equipment Utilized During Treatment: Gait belt;Rolling walker Nurse Communication: Mobility status;Precautions;Other (comment)(using BSC vs bedpan )  Activity Tolerance: Patient tolerated treatment well Patient left: in chair;with call bell/phone within reach;with bed alarm set;with nursing/sitter in room  OT Visit Diagnosis: Other abnormalities of gait and mobility (R26.89)                Time: 0935-1000 OT Time Calculation (min): 25 min Charges:  OT General Charges $OT Visit: 1 Visit OT Evaluation $OT Eval Moderate Complexity: 1 Mod OT Treatments $Self Care/Home Management : 8-22 mins  Chancy Milroyhristie S Selita Staiger, OTR/L  Pager [phone]   Chancy MilroyChristie S Jaelyn Bourgoin 12/28/2017, 11:36 AM

## 2017-12-28 NOTE — Progress Notes (Signed)
Progress note:  Patient seen sitting up in his chair in conjunction with PA.  Son at bedside who has family member on the phone. They're contemplating discharge options and would like to discuss further  Review systems: Denies CP, SOB  Physical exam: Gen.: NAD. Vital signs reviewed. Neurological: Alert and oriented 3 Motor: Bilateral upper extremities 5/5 proximal distal Right lower extremity: Hip flexion 4/5, knee extension 4+/5, ankle dorsiflexion 4+/5 Left lower extremity: Hip flexion 4+/5 Sensation diminished to light touch left foot Psych: Atypical affect  Assessment and plan: 1. left BKA  Clean amputation daily with soap and water Monitor incision site for signs of infection or impending skin breakdown. Staples to remain in place for 3-4 weeks Stump shrinker, for edema control  Scar mobilization massaging to prevent soft tissue adherence Stump protector during therapies Prevent flexion contractures by implementing the following:   Encourage prone lying for 20-30 mins per day BID to avoid hip flexion  Contractures if medically appropriate;  Avoid pillow under knees when patient is lying in bed in order to prevent both  knee and hip flexion contractures;  Avoid prolonged sitting Post surgical pain control with oral medication Phantom limb pain control with physical modalities including desensitization techniques (gentle self massage to the residual stump,hot packs if sensation intact, US) and mirror therapy, TENS. If ineffective, consider pharmacological treatment for neuropathic pain (e.g gabapentin, pregabalin, amytriptalyine, duloxetine).  When using wheelchair, patient should have knee on amputated side fully extended with board under the seat cushion. Avoid injury to contralateral side  Please see inpatient rehabilitation admission coordinator note -ultimately patient and family decided for less intensive [facility].  Recommend PM&R outpatient  follow-up  2. Postoperative pain  Biofeedback training with therapies to help reduce reliance on opiate pain medications, monitor pain control during therapies, and sedation at rest and titrate to maximum efficacy to ensure participation and gains in therapies\

## 2017-12-28 NOTE — Progress Notes (Signed)
Inpatient Rehabilitation-Admissions Coordinator    Spoke with pt and son at length today regarding recommendation for CIR. AC discussed anticipated length of stay, anticipated functional DC level, and provided with brochures. At this time, the pt and the son feel they need a less intensive, longer term rehab to allow the pt's home to be set up prior to DC. Per son, the pt's house is in no condition to return to in the next two weeks. AC has communicated with floor CM /SW regarding new dispo needs. AC will sign off at this time. Please call if questions.   Nanine MeansKelly Chae Shuster, OTR/L  Rehab Admissions Coordinator  [phone] 12/28/2017 4:29 PM

## 2017-12-28 NOTE — Progress Notes (Signed)
Physical Therapy Treatment Patient Details Name: Donald [name] MRN: [id number] DOB: [birth year] Today's Date: 12/28/2017    History of Present Illness Pt is a 67 y.o. M with significant PMH of type 2 diabetes mellitus, peripheral neuropathy, and hypertension who initially presented with sepsis secondary to left foot gangrene. Now s/p left transtibial amputation.    PT Comments    Patient received up in chair, willing to participate in therapy but frustrated in general about level of function/health change and being confined to his room, states "I want to get out and get active, I want a top line prosthetic I can climb trees with and dive and anything I want!". Education and encouragement provided regarding importance of hospital policies and protocols especially with fall prevention, encouragement that if admitted, inpatient rehab will be intense and help him to work towards his goals. Mobility is much improved today, and he was able to perform hop-to gait pattern approximately 71f with RW but was limited by fatigue today. Adjusted positioning of alarm pad and added an extra pillow to assist with comfort in chair today. He was left up in chair with family present, alarm activated, all other needs met this morning. He continues to remain appropriate for CIR moving forward.     Follow Up Recommendations  CIR     Equipment Recommendations  Rolling walker with 5" wheels;Wheelchair (measurements PT);Wheelchair cushion (measurements PT);Other (comment)    Recommendations for Other Services Rehab consult;OT consult     Precautions / Restrictions Precautions Precautions: Fall;Other (comment) Precaution Comments: L BKA, wound vac Required Braces or Orthoses: Other Brace/Splint Other Brace/Splint: limb protector Restrictions Weight Bearing Restrictions: Yes LLE Weight Bearing: Non weight bearing    Mobility  Bed Mobility Overal bed mobility: Needs Assistance Bed Mobility: Supine to Sit     Supine to sit: Supervision     General bed mobility comments: DNT, received up in chair   Transfers Overall transfer level: Needs assistance Equipment used: Rolling walker (2 wheeled) Transfers: Sit to/from Stand Sit to Stand: Min guard Stand pivot transfers: Min assist       General transfer comment: Min guard for safety, VC for sequencing and technique   Ambulation/Gait Ambulation/Gait assistance: Min guard Gait Distance (Feet): 15 Feet Assistive device: Rolling walker (2 wheeled) Gait Pattern/deviations: Step-to pattern;Drifts right/left;Trunk flexed     General Gait Details: hop to pattern with RW, easily fatigued and required seated rest after just 1107f balance much improved    Stairs             Wheelchair Mobility    Modified Rankin (Stroke Patients Only)       Balance Overall balance assessment: Needs assistance Sitting-balance support: No upper extremity supported;Feet supported Sitting balance-Leahy Scale: Normal     Standing balance support: Bilateral upper extremity supported;During functional activity Standing balance-Leahy Scale: Fair Standing balance comment: reliant on BUE support                            Cognition Arousal/Alertness: Awake/alert Behavior During Therapy: WFL for tasks assessed/performed Overall Cognitive Status: Within Functional Limits for tasks assessed Area of Impairment: Problem solving;Awareness;Safety/judgement;Memory                     Memory: Decreased recall of precautions   Safety/Judgement: Decreased awareness of safety;Decreased awareness of deficits Awareness: Emergent Problem Solving: Difficulty sequencing;Requires verbal cues General Comments: patient oriented, requires cues for safety and education regarding safety and  equipment with therapy       Exercises      General Comments General comments (skin integrity, edema, etc.): educated on importance of exiting bed and  mobility as L BKA is healing      Pertinent Vitals/Pain Pain Assessment: 0-10 Pain Score: 6  Pain Location: rear from sitting  Pain Descriptors / Indicators: Sore Pain Intervention(s): Limited activity within patient's tolerance;Repositioned;Monitored during session    Home Living Family/patient expects to be discharged to:: Private residence Living Arrangements: Alone Available Help at Discharge: Family Type of Home: House Home Access: Stairs to enter Entrance Stairs-Rails: None Home Layout: One level Home Equipment: Shower seat      Prior Function Level of Independence: Independent      Comments: reports has 7 dogs    PT Goals (current goals can now be found in the care plan section) Acute Rehab PT Goals Patient Stated Goal: walk without a walker PT Goal Formulation: With patient Time For Goal Achievement: 01/09/18 Potential to Achieve Goals: Good Progress towards PT goals: Progressing toward goals    Frequency    Min 3X/week      PT Plan Current plan remains appropriate    Co-evaluation              AM-PAC PT "6 Clicks" Daily Activity  Outcome Measure  Difficulty turning over in bed (including adjusting bedclothes, sheets and blankets)?: None Difficulty moving from lying on back to sitting on the side of the bed? : A Little Difficulty sitting down on and standing up from a chair with arms (e.g., wheelchair, bedside commode, etc,.)?: A Little Help needed moving to and from a bed to chair (including a wheelchair)?: A Little Help needed walking in hospital room?: A Little Help needed climbing 3-5 steps with a railing? : A Lot 6 Click Score: 18    End of Session Equipment Utilized During Treatment: Gait belt;Other (comment)(L limb protector ) Activity Tolerance: Patient tolerated treatment well Patient left: in chair;with chair alarm set;with family/visitor present;with call bell/phone within reach   PT Visit Diagnosis: Unsteadiness on feet  (R26.81);Other abnormalities of gait and mobility (R26.89);Difficulty in walking, not elsewhere classified (R26.2)     Time: 0174-9449 PT Time Calculation (min) (ACUTE ONLY): 35 min  Charges:  $Gait Training: 8-22 mins $Self Care/Home Management: 8-22                     Deniece Ree PT, DPT, CBIS  Supplemental Physical Therapist [place]   Pager [phone]

## 2017-12-29 LAB — GLUCOSE, CAPILLARY
Glucose-Capillary: 168 mg/dL — ABNORMAL HIGH (ref 70–99)
Glucose-Capillary: 175 mg/dL — ABNORMAL HIGH (ref 70–99)

## 2017-12-29 MED ORDER — GLUCERNA SHAKE PO LIQD: 237.0000 mL | Freq: Two times a day (BID) | ORAL | Status: DC

## 2017-12-29 MED ORDER — GLUCERNA SHAKE PO LIQD
237.0000 mL | Freq: Three times a day (TID) | ORAL | Status: DC
  Administered 2017-12-29 (×2): 237 mL via ORAL

## 2017-12-29 NOTE — Discharge Summary (Signed)
Physician Discharge Summary  Donald [name] ZOX:[id number] DOB: Jan 05, 1951 DOA: 12/21/2017  PCP: Donald Jabs, PA-C  Admit date: 12/21/2017 Discharge date: 12/29/2017  Time spent: 35 minutes  Recommendations for Outpatient Follow-up:  1. Needs CBC, Cmet about 1 week check for bilirubinemia and work-up if needed 2. Donald [name] will need to follow in 2 weeks-keep pump on until seen by Donald. Lajoyce [name] 3. rx given for opiates, gabapentin 4. Suggest Geri psych input given history of hoarding  Discharge Diagnoses:  Principal Problem:   Gangrene of left foot (HCC) Active Problems:   Type II diabetes mellitus (HCC)   Essential hypertension   Renal insufficiency, mild   Hyponatremia   Hyperbilirubinemia   Below knee amputation status, left (HCC)   Postoperative pain   Discharge Condition: improved  Diet recommendation: diabetic, htn  Filed Weights   12/22/17 0814 12/23/17 0504 12/25/17 0722  Weight: 66.5 kg (146 lb 9.7 oz) 69.8 kg (153 lb 14.1 oz) 69.8 kg (153 lb 14.1 oz)    History of present illness:   67 year old male-originally from [place] [moved here to be with family], DM TY 2+ neuropathy, HTN, normochromic normocytic anemia neck, probable daily drinker, BPH, hypogonadism, vitamin D deficiency, ocular hypertension followed by [facility] ophthalmology admit 7/30 nausea chills malaise in a setting of stepping on a nail with his left foot 2 to 3 days prior Found to be febrile 39.2 tachycardia 110 blood pressure stable sinus tach 120 right axis deviation Pertinent labs glucose 364 sodium 127 creatinine 1.5 bili 2.2 WBC 35 hemoglobin 10 lactic acid 2.6 Orthopedics consulted-Vanco Zosyn Tdap-underwent transtibial amputation 8/2    Hospital Course:   Severe sepsis secondary to likely wound infection vision has diabetic peripheral neuropathy probably etiology of the same-transtibial amput per Donald [name] discontinued 8/3  Diarrhea-states he has had diarrhea since he got here-was on  laxatives-not infectious-improved  Type 2 diabetes mellitus aic 5.6 this 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-monitor sugars-cbg 151-196 -Home medications on discharge  BPH with hypogonadism-outpatient follow-up primary care  Ocular hypertension-follow-up with outpatient [facility] physician  Elevated bilirubin-suspect secondary to sepsis as is resolving  Mild metabolic acidosis, mild hyponatremia-monitor labs a.m.-labs 3-4 days  BPH-resumed home Terazosin  Dilutional normocytic anemia-stable in the 8 range transfusion threshold <7  htn-resumed lisinopril on d/c  H/o of hoarding and possible conversion disorder-per daughter house in unkempt and unsafe and she is removing his 7 gods from that locale  Procedures:  L transtibial amputation   Consultations:   ortho  Discharge Exam: Vitals:   12/28/17 1415 12/29/17 0540  BP: 101/63 128/74  Pulse: 95 88  Resp: 18 18  Temp: 98.6 F (37 C) 98.7 F (37.1 C)  SpO2: 98% 98%    General: awake alert eating breakfast did not like the coffee no other issue Cardiovascular: s1 s 2no m Respiratory: cta b Stump noted to be clean and wrapped  Discharge Instructions   Discharge Instructions    Ambulatory referral to Physical Medicine Rehab   Complete by:  As directed    1 month post Left BKA hospital follow-up   Diet - low sodium heart healthy   Complete by:  As directed    Increase activity slowly   Complete by:  As directed    Negative Pressure Wound Therapy - Incisional   Complete by:  As directed    Continue for total of 2 weeks     Allergies as of 12/29/2017   No Known Allergies     Medication List  TAKE these medications   acetaminophen 325 MG tablet Commonly known as:  TYLENOL Take 1-2 tablets (325-650 mg total) by mouth every 6 (six) hours as needed for mild pain (pain score 1-3 or temp > 100.5).   Aflibercept 2 MG/0.05ML Soln by Intravitreal route.   aspirin EC 81 MG tablet Take 81 mg by mouth daily.    dorzolamide-timolol 22.3-6.8 MG/ML ophthalmic solution Commonly known as:  COSOPT Place 1 drop into both eyes 2 times daily.   empagliflozin 10 MG Tabs tablet Commonly known as:  JARDIANCE Take 10 mg by mouth daily.   feeding supplement (GLUCERNA SHAKE) Liqd Take 237 mLs by mouth at bedtime.   gabapentin 300 MG capsule Commonly known as:  NEURONTIN Take 1 capsule (300 mg total) by mouth 3 (three) times daily.   lisinopril 5 MG tablet Commonly known as:  PRINIVIL,ZESTRIL Take 5 mg by mouth daily.   Oxycodone HCl 10 MG Tabs Take 1-1.5 tablets (10-15 mg total) by mouth every 4 (four) hours as needed for severe pain (pain score 7-10).   pravastatin 40 MG tablet Commonly known as:  PRAVACHOL Take 40 mg by mouth daily.   sitaGLIPtin 100 MG tablet Commonly known as:  JANUVIA Take 100 mg by mouth daily.   terazosin 1 MG capsule Commonly known as:  HYTRIN Take 1 mg by mouth at bedtime.      No Known Allergies  Contact information for follow-up providers    Donald Mustarduda, Donald V, MD In 1 week.   Specialty:  Orthopedic Surgery Contact information: [address] [place] [address] [phone]            Contact information for after-discharge care    Destination    [facility] .   Service:  Skilled Nursing Contact information: [address] [place] [id number] [phone]                   The results of significant diagnostics from this hospitalization (including imaging, microbiology, ancillary and laboratory) are listed below for reference.    Significant Diagnostic Studies: Dg Chest 2 View  Result Date: 12/21/2017 CLINICAL DATA:  Chills, fever and weakness. EXAM: CHEST - 2 VIEW COMPARISON:  None. FINDINGS: Cardiac silhouette is upper limits of normal size. Mediastinal silhouette is not suspicious. No pleural effusion or focal consolidation. Mild bronchitic changes. No pneumothorax. Soft tissue planes and  included osseous structures are non suspicious. Lower thoracic dextroscoliosis could be positional. Chronic fragmentation RIGHT AC joint. IMPRESSION: 1. Borderline cardiomegaly. 2. Bronchitic changes without focal consolidation. Electronically Signed   By: Awilda Metroourtnay  Bloomer M.D.   On: 12/21/2017 23:23   Ct Head Wo Contrast  Result Date: 12/21/2017 CLINICAL DATA:  Fevers, chills, nausea/vomiting EXAM: CT HEAD WITHOUT CONTRAST TECHNIQUE: Contiguous axial images were obtained from the base of the skull through the vertex without intravenous contrast. COMPARISON:  MRI brain dated 10/30/2017 FINDINGS: Brain: No evidence of acute infarction, hemorrhage, hydrocephalus, extra-axial collection or mass lesion/mass effect. Mild cortical atrophy. Mild subcortical white matter and periventricular small vessel ischemic changes. Vascular: Intracranial atherosclerosis. Skull: Normal. Negative for fracture or focal lesion. Sinuses/Orbits: The visualized paranasal sinuses are essentially clear. The mastoid air cells are unopacified. Other: None. IMPRESSION: No evidence of acute intracranial abnormality. Mild atrophy with small vessel ischemic changes. Electronically Signed   By: Charline BillsSriyesh  Krishnan M.D.   On: 12/21/2017 23:57   Dg Foot Complete Left  Result Date: 12/22/2017 CLINICAL DATA:  Gangrenous left foot EXAM: LEFT  FOOT - COMPLETE 3+ VIEW COMPARISON:  None. FINDINGS: Mottled lucencies/soft tissue gas throughout the left forefoot, overlying the 1st through 4th digits, extending along the dorsal and plantar surfaces. No definite underlying cortical destruction, although osseous evaluation is obscured due to overlying lucencies. No radiopaque foreign body is seen. IMPRESSION: Extensive soft tissue gas throughout the left forefoot, overlying the dorsal and plantar surfaces of the 1st through 4th digits, compatible with the clinical history of soft tissue gangrene. No definite radiographic findings of underlying osseous  destruction, noting limited evaluation. Electronically Signed   By: Charline Bills M.D.   On: 12/22/2017 00:16    Microbiology: Recent Results (from the past 240 hour(s))  Culture, blood (Routine x 2)     Status: None   Collection Time: 12/21/17 10:58 PM  Result Value Ref Range Status   Specimen Description   Final    BLOOD RIGHT HAND Performed at [facility], [address] [place], [place] [address]    Special Requests   Final    BOTTLES DRAWN AEROBIC AND ANAEROBIC Blood Culture adequate volume Performed at [facility], [address] [place], [place] [address]    Culture   Final    NO GROWTH 5 DAYS Performed at [facility], [address] [place], [place] [address]    Report Status 12/27/2017 FINAL  Final  Culture, blood (Routine x 2)     Status: None   Collection Time: 12/21/17 11:00 PM  Result Value Ref Range Status   Specimen Description   Final    BLOOD LEFT FOREARM Performed at [facility], [address] [place], [place] [address]    Special Requests   Final    BOTTLES DRAWN AEROBIC ONLY Blood Culture results may not be optimal due to an inadequate volume of blood received in culture bottles Performed at [facility], [address] [place], [place] [address]    Culture   Final    NO GROWTH 5 DAYS Performed at [facility], [address] [place], [place] [address]    Report Status 12/27/2017 FINAL  Final  Surgical pcr screen     Status: None   Collection Time: 12/24/17  6:32 AM  Result Value Ref Range Status   MRSA, PCR NEGATIVE NEGATIVE Final   Staphylococcus aureus NEGATIVE NEGATIVE Final    Comment: (NOTE) The Xpert SA Assay (FDA approved for NASAL specimens in patients 63 years of age and older), is one component of a comprehensive surveillance program. It is not intended to diagnose infection nor to guide or monitor treatment. Performed at [facility], [address]  [place], [place] [address]      Labs: Basic Metabolic Panel: Recent Labs  Lab 12/24/17 0453 12/25/17 0534 12/26/17 0555 12/27/17 0722  NA 131* 130* 134* 133*  K 3.6 3.9 3.8 3.9  CL 98 100 99 102  CO2 22 21* 23 24  GLUCOSE 231* 166* 249* 166*  BUN 31* 29* 32* 24*  CREATININE 1.35* 1.31* 1.12 1.13  CALCIUM 8.2* 8.2* 8.2* 8.2*   Liver Function Tests: Recent Labs  Lab 12/24/17 0453 12/25/17 0534 12/26/17 0555  AST 17 59* 54*  ALT 17 29 31   ALKPHOS 93 110 77  BILITOT 1.2 1.7* 1.2  PROT 6.0* 5.9* 5.6*  ALBUMIN 1.7* 1.7* 1.7*   No results for input(s): LIPASE, AMYLASE in the last 168 hours. No results for input(s):  AMMONIA in the last 168 hours. CBC: Recent Labs  Lab 12/24/17 0453 12/25/17 0534 12/26/17 0555 12/27/17 0722  WBC 28.1* 28.3* 17.8* 16.5*  NEUTROABS 23.9* 23.8* 13.9*  --   HGB 8.7* 8.8* 8.5* 8.3*  HCT 25.8* 25.9* 24.7* 25.5*  MCV 90.5 91.2 91.1 94.1  PLT 366 451* 435* 542*   Cardiac Enzymes: No results for input(s): CKTOTAL, CKMB, CKMBINDEX, TROPONINI in the last 168 hours. BNP: BNP (last 3 results) No results for input(s): BNP in the last 8760 hours.  ProBNP (last 3 results) No results for input(s): PROBNP in the last 8760 hours.  CBG: Recent Labs  Lab 12/28/17 1213 12/28/17 1733 12/28/17 2213 12/29/17 0736 12/29/17 1224  GLUCAP 149* 175* 202* 168* 175*       Signed:  Rhetta Mura MD   [facility] 12/29/2017, 1:12 PM

## 2017-12-29 NOTE — Progress Notes (Signed)
Nutrition Follow-up  DOCUMENTATION CODES:   Not applicable  INTERVENTION:   -D/c Juven d/t poor acceptance -Increase Glucerna Shake po to TID, each supplement provides 220 kcal and 10 grams of protein -Continue MVI with minerals daily  NUTRITION DIAGNOSIS:   Increased nutrient needs related to wound healing, post-op healing as evidenced by estimated needs.  Ongoing  GOAL:   Patient will meet greater than or equal to 90% of their needs  Progressing  MONITOR:   PO intake, Supplement acceptance, Labs, Weight trends, Skin, I & O's  REASON FOR ASSESSMENT:   Consult Wound healing  ASSESSMENT:   Clarisse GougeMichael Platner is a 67 y.o. male with medical history significant for type 2 diabetes mellitus, peripheral neuropathy, and hypertension, now presenting to the emergency department for evaluation of fevers, chills, malaise, and generalized weakness.  Patient reports that he been in his usual state until approximately 2 weeks ago when he began to develop intermittent chills and a general malaise.   8/2- s/p lt transtibial amputation with wound vac placement  Case discussed with RN prior to visit, who confirms pt does not like Juven supplements, however, loves Glucerna shakes and has been tolerating them well. Pt is awaiting placement prior to discharge (CIR vs SNF).   Spoke with pt at bedside, who is in good spirits, smiling and joking with this RD at time of visit. He shares that his appetite is fair, depending on the day. Meal completion 50-100%. Pt reports he really enjoys the Glucerna supplements and is requesting them "as often as I can get them". Pt endorses the Juven supplement makes him sick ("I threw up the last time I drank that"). Discussed with pt importance of good meal and supplement intake to promote healing, as well as importance of DM self-management and food care.   Labs reviewed: CBGS: 149-202 (inpatient orders for glycemic control are 0-5 units insulin aspart q HS, 0-9  units insulin aspart TID with meals, and 5 mg linagliptin daily).   Diet Order:   Diet Order           Diet - low sodium heart healthy        Diet Carb Modified Fluid consistency: Thin; Room service appropriate? Yes  Diet effective now          EDUCATION NEEDS:   Education needs have been addressed  Skin:  Skin Assessment: Skin Integrity Issues: Skin Integrity Issues:: Incisions, Wound VAC Wound Vac: wound vac to lt BKA stump Incisions: lt leg Other: n/a  Last BM:  12/28/17  Height:   Ht Readings from Last 1 Encounters:  12/25/17 5\' 5"  (1.651 m)    Weight:   Wt Readings from Last 1 Encounters:  12/25/17 153 lb 14.1 oz (69.8 kg)    Ideal Body Weight:  57.8 kg  BMI:  Body mass index is 25.61 kg/m.  Estimated Nutritional Needs:   Kcal:  1700-1900  Protein:  100-115 grams  Fluid:  > 1.7 L    Grier Czerwinski A. Mayford KnifeWilliams, RD, LDN, CDE Pager: [phone] After hours Pager: [phone]

## 2017-12-29 NOTE — Care Management Important Message (Signed)
Important Message  Patient Details  Name: Clarisse GougeMichael Mainer MRN: [id number] Date of Birth: 04/13/1951   Medicare Important Message Given:  Yes    Khanh Cordner 12/29/2017, 9:10 AM

## 2017-12-29 NOTE — Social Work (Signed)
Clinical Social Worker facilitated patient discharge including contacting patient family and facility to confirm patient discharge plans.  Clinical information faxed to facility and family agreeable with plan.  CSW [facility].  RN to call [phone] with report  prior to discharge.  Clinical Social Worker will sign off for now as social work intervention is no longer needed. Please consult us again if new need arises.  Doy HutchingIsabel H Colbert Curenton, [place] Clinical Social Worker [phone]

## 2017-12-29 NOTE — NC FL2 (Signed)
[place] MEDICAID FL2 LEVEL OF CARE SCREENING TOOL     IDENTIFICATION  Patient Name: Donald [name] Birthdate: [birth year] Sex: male Admission Date (Current Location): 12/21/2017  [facility] and [place] Number:  Producer, television/film/video and Address:  [facility] [place], [address] [place], [place] [address]      Provider Number: [id number]  Attending Physician Name and Address:  Rhetta Mura, MD  Relative Name and Phone Number:  [name], daughter, [phone]    Current Level of Care: Hospital Recommended Level of Care: Skilled Nursing Facility Prior Approval Number:    Date Approved/Denied:   PASRR Number: [id number]  Discharge Plan: SNF    Current Diagnoses: Patient Active Problem List   Diagnosis Date Noted  . Below knee amputation status, left (HCC)   . Postoperative pain   . Gangrene of left foot (HCC) 12/22/2017  . Type II diabetes mellitus (HCC) 12/22/2017  . Essential hypertension 12/22/2017  . Renal insufficiency, mild 12/22/2017  . Hyponatremia 12/22/2017  . Hyperbilirubinemia 12/22/2017  . Severe protein-calorie malnutrition (HCC)   . Diabetic polyneuropathy associated with type 2 diabetes mellitus (HCC)     Orientation RESPIRATION BLADDER Height & Weight     Self, Time, Situation, Place  Normal Continent Weight: 153 lb 14.1 oz (69.8 kg) Height:  5\' 5"  (165.1 cm)  BEHAVIORAL SYMPTOMS/MOOD NEUROLOGICAL BOWEL NUTRITION STATUS      Continent Diet(see discharge summary)  AMBULATORY STATUS COMMUNICATION OF NEEDS Skin   Extensive Assist Verbally Surgical wounds, Wound Vac                       Personal Care Assistance Level of Assistance  Bathing, Feeding, Dressing Bathing Assistance: Maximum assistance Feeding assistance: Independent Dressing Assistance: Maximum assistance     Functional Limitations Info  Sight, Hearing, Speech Sight Info: Adequate Hearing Info: Adequate Speech Info: Adequate    SPECIAL CARE  FACTORS FREQUENCY  PT (By licensed PT), OT (By licensed OT)     PT Frequency: 5x week OT Frequency: 5x week            Contractures Contractures Info: Not present    Additional Factors Info  Code Status, Allergies, Insulin Sliding Scale Code Status Info: Full Code Allergies Info: No Known Allergies   Insulin Sliding Scale Info: insulin aspart (novoLOG) injection 0-9 Units every 4 hours       Current Medications (12/29/2017):  This is the current hospital active medication list Current Facility-Administered Medications  Medication Dose Route Frequency Provider Last Rate Last Dose  . acetaminophen (TYLENOL) tablet 650 mg  650 mg Oral Q6H PRN Nadara Mustard, MD   650 mg at 12/23/17 2035   Or  . acetaminophen (TYLENOL) suppository 650 mg  650 mg Rectal Q6H PRN Nadara Mustard, MD      . acetaminophen (TYLENOL) tablet 325-650 mg  325-650 mg Oral Q6H PRN Nadara Mustard, MD      . canagliflozin [facility]) tablet 100 mg  100 mg Oral QAC breakfast Nadara Mustard, MD   100 mg at 12/29/17 0849  . dorzolamide (TRUSOPT) 2 % ophthalmic solution 1 drop  1 drop Both Eyes BID Nadara Mustard, MD   1 drop at 12/28/17 2218   And  . timolol (TIMOPTIC) 0.5 % ophthalmic solution 1 drop  1 drop Both Eyes BID Nadara Mustard, MD   1 drop at 12/28/17 2218  . feeding supplement (GLUCERNA SHAKE) (GLUCERNA SHAKE) liquid 237 mL  237 mL Oral QHS Nadara Mustarduda, Marcus V, MD   237 mL at 12/28/17 2220  . gabapentin (NEURONTIN) capsule 300 mg  300 mg Oral TID Nadara Mustarduda, Marcus V, MD   300 mg at 12/28/17 2220  . HYDROmorphone (DILAUDID) injection 0.5-1 mg  0.5-1 mg Intravenous Q4H PRN Nadara Mustarduda, Marcus V, MD   1 mg at 12/25/17 2010  . insulin aspart (novoLOG) injection 0-5 Units  0-5 Units Subcutaneous QHS Rhetta MuraSamtani, Jai-Gurmukh, MD   2 Units at 12/28/17 2221  . insulin aspart (novoLOG) injection 0-9 Units  0-9 Units Subcutaneous TID WC Rhetta MuraSamtani, Jai-Gurmukh, MD   2 Units at 12/29/17 0849  . lactated ringers infusion   Intravenous  Continuous Nadara Mustarduda, Marcus V, MD   Stopped at 12/25/17 1055  . linagliptin (TRADJENTA) tablet 5 mg  5 mg Oral Daily Nadara Mustarduda, Marcus V, MD   5 mg at 12/28/17 1007  . morphine 4 MG/ML injection 1-3 mg  1-3 mg Intravenous Q4H PRN Nadara Mustarduda, Marcus V, MD      . multivitamin with minerals tablet 1 tablet  1 tablet Oral Daily Nadara Mustarduda, Marcus V, MD   1 tablet at 12/28/17 1007  . nutrition supplement (JUVEN) (JUVEN) powder packet 1 packet  1 packet Oral BID BM Nadara Mustarduda, Marcus V, MD   1 packet at 12/28/17 1006  . ondansetron (ZOFRAN) tablet 4 mg  4 mg Oral Q6H PRN Nadara Mustarduda, Marcus V, MD       Or  . ondansetron [facility](ZOFRAN) injection 4 mg  4 mg Intravenous Q6H PRN Nadara Mustarduda, Marcus V, MD   4 mg at 12/25/17 2141  . ondansetron (ZOFRAN) tablet 4 mg  4 mg Oral Q6H PRN Nadara Mustarduda, Marcus V, MD       Or  . ondansetron [facility](ZOFRAN) injection 4 mg  4 mg Intravenous Q6H PRN Nadara Mustarduda, Marcus V, MD   4 mg at 12/26/17 1757  . oxyCODONE (Oxy IR/ROXICODONE) immediate release tablet 10-15 mg  10-15 mg Oral Q4H PRN Nadara Mustarduda, Marcus V, MD   10 mg at 12/26/17 1244  . oxyCODONE (Oxy IR/ROXICODONE) immediate release tablet 5-10 mg  5-10 mg Oral Q4H PRN Nadara Mustarduda, Marcus V, MD   10 mg at 12/28/17 2219  . pneumococcal 23 valent vaccine (PNU-IMMUNE) injection 0.5 mL  0.5 mL Intramuscular Tomorrow-1000 Nadara Mustarduda, Marcus V, MD      . polyethylene glycol (MIRALAX / GLYCOLAX) packet 17 g  17 g Oral Daily PRN Nadara Mustarduda, Marcus V, MD      . pravastatin (PRAVACHOL) tablet 40 mg  40 mg Oral q1800 Nadara Mustarduda, Marcus V, MD   40 mg at 12/28/17 1800  . senna-docusate (Senokot-S) tablet 1 tablet  1 tablet Oral QHS PRN Nadara Mustarduda, Marcus V, MD      . terazosin (HYTRIN) capsule 1 mg  1 mg Oral QHS Nadara Mustarduda, Marcus V, MD   1 mg at 12/28/17 2220     Discharge Medications: Please see discharge summary for a list of discharge medications.  Relevant Imaging Results:  Relevant Lab Results:   Additional Information SS# [phone]  Jacobe Study H [place], [place]

## 2017-12-29 NOTE — Clinical Social Work Note (Signed)
Clinical Social Work Assessment  Patient Details  Name: Donald GougeMichael Demaria MRN: [id number] Date of Birth: 07/16/1950  Date of referral:  12/29/17               Reason for consult:  Facility Placement, Discharge Planning                Permission sought to share information with:  Facility Medical sales representativeContact Representative, Family Supports Permission granted to share information::  Yes, Verbal Permission Granted  Name::     Casimiro NeedleMichael and OrthoptistTamina  Agency::  SNFs  Relationship::  son and daughter  Contact Information:     Housing/Transportation Living arrangements for the past 2 months:  Single Family Home Source of Information:  Patient, Adult Children Patient Interpreter Needed:  None Criminal Activity/Legal Involvement Pertinent to Current Situation/Hospitalization:  No - Comment as needed Significant Relationships:  Adult Children Lives with:  Self Do you feel safe going back to the place where you live?  Yes Need for family participation in patient care:  Yes (Comment)  Care giving concerns:  Pt states that he lives alone with his dogs, he expresses desire to take multiple juice cans and items with him to rehab facility (per pt children report pt hoards in his home and it is generally unclean). Pt new amputee.    Social Worker assessment / plan:  CSW spoke with pt children Casimiro NeedleMichael and Shieldsamina, both desire SNF placement for pt so that he can receive therapy supports given that he is new amputee. Both children express that pt home is not appropriate for healing and rehab. Pt children state that he lives by himself, pt son is nearby. Pt daughter is Charity fundraiserN at [employer]. Both active in pt's health.   Pt confirms he lives alone with 7 dogs, pt states that he is agreeable to SNF placement, understands that it is similar to hospital but with less frequent supervision and intensive therapies. Pt agreeable.   Employment status:  Unemployed Health and safety inspectornsurance information:  Medicare PT Recommendations:  Skilled  Holiday representativeursing Facility, 24 Hour Supervision Information / Referral to community resources:  Skilled Nursing Facility  Patient/Family's Response to care:  Pt blunt and accepting of CSW meeting and referral for SNF. Pt children accepting of SNF, grateful for CSW assistance with placement.   Patient/Family's Understanding of and Emotional Response to Diagnosis, Current Treatment, and Prognosis:  Pt and pt family state understanding of diagnosis, current treatment and prognosis. Pt seems content at home with his dogs and expresses understanding of need for rehab. Pt family very grateful for support with placement as they desire to make pt's house more inhabitable and safe before return so that pt is healthy and well after discharge from rehab.   Emotional Assessment Appearance:  Appears stated age Attitude/Demeanor/Rapport:  Self-Absorbed, Self-Confident, Engaged Affect (typically observed):  Accepting, Blunt, Irritable Orientation:  Oriented to Self, Oriented to Place, Oriented to  Time, Oriented to Situation Alcohol / Substance use:  Not Applicable Psych involvement (Current and /or in the community):  No (Comment)  Discharge Needs  Concerns to be addressed:  Care Coordination, Discharge Planning Concerns Readmission within the last 30 days:  No Current discharge risk:  Dependent with Mobility, Lives alone Barriers to Discharge:  Continued Medical Work up   [employer]sabel H Zamarah Ullmer, LCSWA 12/29/2017, 2:42 PM

## 2017-12-29 NOTE — Progress Notes (Signed)
Pt us [facility].  Discharge instructions and prescriptions given.  Report given to receiving RN.

## 2017-12-29 NOTE — Social Work (Addendum)
Pt son and pt daughter have reviewed SNF offers, they would like [facility]. CSW has spoken with admissions liaison Yaakov GuthrieKelly, [facility] able to accept pt today.   12:26pm- Pt son reviewing if there is any additional choice for a private room. CSW will follow.   Doy HutchingIsabel H Madysun Thall, LCSWA [facility] Clinical Social Work [phone]

## 2017-12-29 NOTE — Clinical Social Work Placement (Addendum)
   CLINICAL SOCIAL WORK PLACEMENT  NOTE [facility] room 610A RN to call report to [phone]  Date:  12/29/2017  Patient Details  Name: Donald [name] MRN: [id number] Date of Birth: 12/02/1950  Clinical Social Work is seeking post-discharge placement for this patient at the Skilled  Nursing Facility level of care (*CSW will initial, date and re-position this form in  chart as items are completed):  Yes   Patient/family provided with [facility] Clinical Social Work Department's list of facilities offering this level of care within the geographic area requested by the patient (or if unable, by the patient's family).  Yes   Patient/family informed of their freedom to choose among providers that offer the needed level of care, that participate in Medicare, Medicaid or managed care program needed by the patient, have an available bed and are willing to accept the patient.  Yes   Patient/family informed of [facility] ownership interest in [facility] and [facility], as well as of the fact that they are under no obligation to receive care at these facilities.  PASRR submitted to EDS on       PASRR number received on 12/29/17     Existing PASRR number confirmed on       FL2 transmitted to all facilities in geographic area requested by pt/family on 12/28/17     FL2 transmitted to all facilities within larger geographic area on       Patient informed that his/her managed care company has contracts with or will negotiate with certain facilities, including the following:        Yes   Patient/family informed of bed offers received.  [facility]     Physician recommends and patient chooses bed at      [facility] 12[facility].  Patient to be transferred to facility by [facility]     Patient family notified on 12/29/17 of transfer.  Name of family member notified:  daughter Gwendolyn Grantamina and son Casimiro NeedleMichael     PHYSICIAN       Additional  Comment:    _______________________________________________ Doy HutchingIsabel H Amadea Keagy, LCSWA 12/29/2017, 2:45 PM

## 2017-12-31 ENCOUNTER — Telehealth (INDEPENDENT_AMBULATORY_CARE_PROVIDER_SITE_OTHER): Payer: Self-pay | Admitting: Orthopedic Surgery

## 2017-12-31 NOTE — Telephone Encounter (Signed)
Donald PernaHolly johnson called left voicemail message needing clarification on orders for new BKA. [name] need to know if they can take wound protector off and do any range of motion. The ph# is [phone]  Fax# [phone]

## 2018-01-01 ENCOUNTER — Other Ambulatory Visit (INDEPENDENT_AMBULATORY_CARE_PROVIDER_SITE_OTHER): Payer: Self-pay

## 2018-01-01 NOTE — Telephone Encounter (Signed)
Pt is s/p a BKA will call to advise ok to remove limb protector and work on ROM. Should use while not doing therapy.

## 2018-01-01 NOTE — Telephone Encounter (Signed)
Order faxed to facility to number provided.

## 2018-01-12 ENCOUNTER — Ambulatory Visit (INDEPENDENT_AMBULATORY_CARE_PROVIDER_SITE_OTHER): Payer: Medicare Other | Admitting: Orthopedic Surgery

## 2018-01-13 ENCOUNTER — Encounter (INDEPENDENT_AMBULATORY_CARE_PROVIDER_SITE_OTHER): Payer: Self-pay | Admitting: Family

## 2018-01-13 ENCOUNTER — Ambulatory Visit (INDEPENDENT_AMBULATORY_CARE_PROVIDER_SITE_OTHER): Payer: Medicare Other | Admitting: Family

## 2018-01-13 DIAGNOSIS — Z89512 Acquired absence of left leg below knee: Secondary | ICD-10-CM | POA: Diagnosis not present

## 2018-01-13 DIAGNOSIS — IMO0002 Reserved for concepts with insufficient information to code with codable children: Secondary | ICD-10-CM

## 2018-01-13 NOTE — Progress Notes (Signed)
   Post-Op Visit Note   Patient: Donald [name]           Date of Birth: 05/19/1951           MRN: [id number] Visit Date: 01/13/2018 PCP: Claybon JabsBrown, Emily, PA-C  Chief Complaint: No chief complaint on file.   HPI:  HPI Is patient is a 67 year old gentleman who is 2 weeks status post left below the knee amputation.  Wound VAC in place however states that it stopped working 2 days ago sent in the wound VAC still in place. Ortho Exam Incision well approximated with staples no drainage no maceration no erythema no odor no sign of infection  Visit Diagnoses:  1. Below knee amputation status, left (HCC)     Plan: Apply dry dressings daily following Dial soap cleansing.  Wear shrinker daily.  Follow-up in the office in 2 weeks for staple removal.  Follow-Up Instructions: Return in about 2 weeks (around 01/27/2018).   Imaging: No results found.  Orders:  No orders of the defined types were placed in this encounter.  No orders of the defined types were placed in this encounter.    PMFS History: Patient Active Problem List   Diagnosis Date Noted  . Below knee amputation status, left (HCC)   . Postoperative pain   . Gangrene of left foot (HCC) 12/22/2017  . Type II diabetes mellitus (HCC) 12/22/2017  . Essential hypertension 12/22/2017  . Renal insufficiency, mild 12/22/2017  . Hyponatremia 12/22/2017  . Hyperbilirubinemia 12/22/2017  . Severe protein-calorie malnutrition (HCC)   . Diabetic polyneuropathy associated with type 2 diabetes mellitus (HCC)    Past Medical History:  Diagnosis Date  . Diabetic peripheral neuropathy (HCC)   . Gangrene of left foot (HCC)   . High cholesterol   . Hypertension   . OSA on CPAP   . Type II diabetes mellitus (HCC)     History reviewed. No pertinent family history.  Past Surgical History:  Procedure Laterality Date  . AMPUTATION Left 12/25/2017   Procedure: LEFT BELOW KNEE AMPUTATION;  Surgeon: Nadara Mustarduda, Marcus V, MD;  [facility];   Service: Orthopedics;  Laterality: Left;  . HEEL SPUR EXCISION Bilateral 1992  . ROTATOR CUFF REPAIR Left 1990s   Social History   Occupational History  . Not on file  Tobacco Use  . Smoking status: Never Smoker  . Smokeless tobacco: Never Used  Substance and Sexual Activity  . Alcohol use: Not Currently    Frequency: Never  . Drug use: Never  . Sexual activity: Not Currently

## 2018-01-27 ENCOUNTER — Encounter (INDEPENDENT_AMBULATORY_CARE_PROVIDER_SITE_OTHER): Payer: Self-pay | Admitting: Family

## 2018-01-27 ENCOUNTER — Ambulatory Visit (INDEPENDENT_AMBULATORY_CARE_PROVIDER_SITE_OTHER): Payer: Medicare Other | Admitting: Family

## 2018-01-27 DIAGNOSIS — Z89512 Acquired absence of left leg below knee: Secondary | ICD-10-CM | POA: Diagnosis not present

## 2018-01-27 DIAGNOSIS — IMO0002 Reserved for concepts with insufficient information to code with codable children: Secondary | ICD-10-CM

## 2018-01-27 NOTE — Progress Notes (Signed)
   Post-Op Visit Note   Patient: Donald [name]           Date of Birth: 07-09-1950           MRN: [id number] Visit Date: 01/27/2018 PCP: [name], PA-C  Chief Complaint: No chief complaint on file.   HPI:  HPI Is patient is a 67 year old gentleman who is status post left below the knee amputation    Ortho Exam Incision well healed. No drainage no maceration no erythema no odor no sign of infection  Visit Diagnoses:  No diagnosis found.  Plan: Continue with Dial soap cleansing.  Wear shrinker daily.  Follow-up in the office in 4 weeks. Given an order for his prosthesis set up to [employer].   Follow-Up Instructions: No follow-ups on file.   Imaging: No results found.  Orders:  No orders of the defined types were placed in this encounter.  No orders of the defined types were placed in this encounter.    PMFS History: Patient Active Problem List   Diagnosis Date Noted  . Below knee amputation status, left (HCC)   . Postoperative pain   . Gangrene of left foot (HCC) 12/22/2017  . Type II diabetes mellitus (HCC) 12/22/2017  . Essential hypertension 12/22/2017  . Renal insufficiency, mild 12/22/2017  . Hyponatremia 12/22/2017  . Hyperbilirubinemia 12/22/2017  . Severe protein-calorie malnutrition (HCC)   . Diabetic polyneuropathy associated with type 2 diabetes mellitus (HCC)    Past Medical History:  Diagnosis Date  . Diabetic peripheral neuropathy (HCC)   . Gangrene of left foot (HCC)   . High cholesterol   . Hypertension   . OSA on CPAP   . Type II diabetes mellitus (HCC)     No family history on file.  Past Surgical History:  Procedure Laterality Date  . AMPUTATION Left 12/25/2017   Procedure: LEFT BELOW KNEE AMPUTATION;  Surgeon: Nadara Mustard, MD;  Location[facility] OR;  Service: Orthopedics;  Laterality: Left;  . HEEL SPUR EXCISION Bilateral 1992  . ROTATOR CUFF REPAIR Left 1990s   Social History   Occupational History  . Not on file  Tobacco Use  .  Smoking status: Never Smoker  . Smokeless tobacco: Never Used  Substance and Sexual Activity  . Alcohol use: Not Currently    Frequency: Never  . Drug use: Never  . Sexual activity: Not Currently

## 2018-02-17 ENCOUNTER — Encounter (INDEPENDENT_AMBULATORY_CARE_PROVIDER_SITE_OTHER): Payer: Self-pay | Admitting: Family

## 2018-02-17 ENCOUNTER — Ambulatory Visit (INDEPENDENT_AMBULATORY_CARE_PROVIDER_SITE_OTHER): Payer: Medicare Other | Admitting: Family

## 2018-02-17 VITALS — Ht 65.0 in | Wt 153.9 lb

## 2018-02-17 DIAGNOSIS — Z89512 Acquired absence of left leg below knee: Secondary | ICD-10-CM

## 2018-02-17 DIAGNOSIS — IMO0002 Reserved for concepts with insufficient information to code with codable children: Secondary | ICD-10-CM

## 2018-02-17 MED ORDER — SILVER SULFADIAZINE 1 % EX CREA: 1.0000 "application " | TOPICAL_CREAM | Freq: Every day | CUTANEOUS | 0 refills | Status: AC

## 2018-02-17 NOTE — Progress Notes (Signed)
   Post-Op Visit Note   Patient: Donald [name]           Date of Birth: 1950-08-26           MRN: [id number] Visit Date: 02/17/2018 PCP: [name], PA-C  Chief Complaint:  Chief Complaint  Patient presents with  . Left Leg - Routine Post Op    BKA    HPI:  HPI Is patient is a 67 year old gentleman who is status post left below the knee amputation on 12/25/17. Is now home doing own wound care. Dressing in place today was placed by rehab. States HH nursing to start next week.   Ortho Exam Incision healing well. Two remaining open areas. At lateral and medial end of incision. Are 1 cm and 5 mm in diameter respectively. Covered with 100% fibrinous tissue. No drainage no maceration no erythema no odor no sign of infection  Visit Diagnoses:  1. Below knee amputation status, left (HCC)     Plan: Continue with Dial soap cleansing.  Apply silvadene dressings daily. Applied iodosorb dressing today. Wear shrinker daily.  Follow-up in the office in 4 weeks. Follow with [employer] for prosthesis set up. To call for tighter shrinker as is in 3 xl today which is too large.   Follow-Up Instructions: Return in about 4 weeks (around 03/17/2018).   Imaging: No results found.  Orders:  No orders of the defined types were placed in this encounter.  No orders of the defined types were placed in this encounter.    PMFS History: Patient Active Problem List   Diagnosis Date Noted  . Below knee amputation status, left (HCC)   . Postoperative pain   . Type II diabetes mellitus (HCC) 12/22/2017  . Essential hypertension 12/22/2017  . Renal insufficiency, mild 12/22/2017  . Hyponatremia 12/22/2017  . Hyperbilirubinemia 12/22/2017  . Severe protein-calorie malnutrition (HCC)   . Diabetic polyneuropathy associated with type 2 diabetes mellitus (HCC)    Past Medical History:  Diagnosis Date  . Diabetic peripheral neuropathy (HCC)   . Gangrene of left foot (HCC)   . High cholesterol   .  Hypertension   . OSA on CPAP   . Type II diabetes mellitus (HCC)     History reviewed. No pertinent family history.  Past Surgical History:  Procedure Laterality Date  . AMPUTATION Left 12/25/2017   Procedure: LEFT BELOW KNEE AMPUTATION;  Surgeon: Nadara Mustard, MD;  Location[facility] OR;  Service: Orthopedics;  Laterality: Left;  . HEEL SPUR EXCISION Bilateral 1992  . ROTATOR CUFF REPAIR Left 1990s   Social History   Occupational History  . Not on file  Tobacco Use  . Smoking status: Never Smoker  . Smokeless tobacco: Never Used  Substance and Sexual Activity  . Alcohol use: Not Currently    Frequency: Never  . Drug use: Never  . Sexual activity: Not Currently

## 2018-03-17 ENCOUNTER — Ambulatory Visit (INDEPENDENT_AMBULATORY_CARE_PROVIDER_SITE_OTHER): Payer: Medicare Other | Admitting: Family

## 2018-03-17 ENCOUNTER — Encounter (INDEPENDENT_AMBULATORY_CARE_PROVIDER_SITE_OTHER): Payer: Self-pay | Admitting: Family

## 2018-03-17 VITALS — Ht 65.0 in | Wt 153.9 lb

## 2018-03-17 DIAGNOSIS — S88112A Complete traumatic amputation at level between knee and ankle, left lower leg, initial encounter: Secondary | ICD-10-CM

## 2018-03-17 DIAGNOSIS — Z89512 Acquired absence of left leg below knee: Secondary | ICD-10-CM

## 2018-03-17 NOTE — Progress Notes (Signed)
   Post-Op Visit Note   Patient: Donald [name]           Date of Birth: [birth year]           MRN: [id number] Visit Date: 03/17/2018 PCP: [name], PA-C  Chief Complaint:  Chief Complaint  Patient presents with  . Left Leg - Routine Post Op    BKA left leg    HPI:  HPI patient is a 67 year old gentleman who is status post left below the knee amputation on 12/25/17. Following with [employer] for prosthesis fabrication.  Ortho Exam Incision well healed. one remaining open area medially.  There is a 1 cm in diameter eschar. No drainage no maceration no erythema no odor no sign of infection. iodosorb dressing applied. Limb is well consolidated.  Visit Diagnoses:  1. Below-knee amputation of left lower extremity (HCC)     Plan: Continue with Dial soap cleansing.  Apply silvadene dressings daily. Applied iodosorb dressing today. Wear shrinker daily.  Follow-up in the office in 4 weeks.   Follow-Up Instructions: Return in about 4 weeks (around 04/14/2018).   Imaging: No results found.  Orders:  No orders of the defined types were placed in this encounter.  No orders of the defined types were placed in this encounter.    PMFS History: Patient Active Problem List   Diagnosis Date Noted  . Below-knee amputation of left lower extremity (HCC)   . Postoperative pain   . Type II diabetes mellitus (HCC) 12/22/2017  . Essential hypertension 12/22/2017  . Renal insufficiency, mild 12/22/2017  . Hyponatremia 12/22/2017  . Hyperbilirubinemia 12/22/2017  . Severe protein-calorie malnutrition (HCC)   . Diabetic polyneuropathy associated with type 2 diabetes mellitus (HCC)    Past Medical History:  Diagnosis Date  . Diabetic peripheral neuropathy (HCC)   . Gangrene of left foot (HCC)   . High cholesterol   . Hypertension   . OSA on CPAP   . Type II diabetes mellitus (HCC)     History reviewed. No pertinent family history.  Past Surgical History:  Procedure Laterality Date  .  AMPUTATION Left 12/25/2017   Procedure: LEFT BELOW KNEE AMPUTATION;  Surgeon: Nadara Mustard, MD;  Location: [facility] OR;  Service: Orthopedics;  Laterality: Left;  . HEEL SPUR EXCISION Bilateral 1992  . ROTATOR CUFF REPAIR Left 1990s   Social History   Occupational History  . Not on file  Tobacco Use  . Smoking status: Never Smoker  . Smokeless tobacco: Never Used  Substance and Sexual Activity  . Alcohol use: Not Currently    Frequency: Never  . Drug use: Never  . Sexual activity: Not Currently

## 2018-03-31 ENCOUNTER — Other Ambulatory Visit (INDEPENDENT_AMBULATORY_CARE_PROVIDER_SITE_OTHER): Payer: Self-pay

## 2018-03-31 ENCOUNTER — Telehealth (INDEPENDENT_AMBULATORY_CARE_PROVIDER_SITE_OTHER): Payer: Self-pay | Admitting: Orthopedic Surgery

## 2018-03-31 DIAGNOSIS — S88112A Complete traumatic amputation at level between knee and ankle, left lower leg, initial encounter: Secondary | ICD-10-CM

## 2018-03-31 NOTE — Telephone Encounter (Signed)
Order for [facility] out patient neuro rehab for left BKA gait training.

## 2018-03-31 NOTE — Telephone Encounter (Signed)
Donald [name] at [facility] is requesting an order be sent to them for physical therapy for gait training.  [facility] # [phone]

## 2018-04-07 ENCOUNTER — Other Ambulatory Visit: Payer: Self-pay

## 2018-04-07 ENCOUNTER — Encounter: Payer: Self-pay | Admitting: Physical Therapy

## 2018-04-07 ENCOUNTER — Ambulatory Visit: Payer: Medicare Other | Attending: Orthopedic Surgery | Admitting: Physical Therapy

## 2018-04-07 DIAGNOSIS — M79605 Pain in left leg: Secondary | ICD-10-CM

## 2018-04-07 DIAGNOSIS — R2689 Other abnormalities of gait and mobility: Secondary | ICD-10-CM

## 2018-04-07 DIAGNOSIS — M6281 Muscle weakness (generalized): Secondary | ICD-10-CM | POA: Diagnosis present

## 2018-04-07 DIAGNOSIS — R293 Abnormal posture: Secondary | ICD-10-CM | POA: Diagnosis present

## 2018-04-07 DIAGNOSIS — R2681 Unsteadiness on feet: Secondary | ICD-10-CM | POA: Diagnosis present

## 2018-04-07 NOTE — Therapy (Signed)
[facility] [address] [place], [place], [address] Phone: [phone]   Fax:  [phone]  Physical Therapy Evaluation  Patient Details  Name: Donald [name] MRN: [id number] Date of Birth: 1950-11-15 Referring Provider (PT): Aldean Baker, MD   Encounter Date: 04/07/2018  PT End of Session - 04/07/18 0846    Visit Number  1    Number of Visits  25    Date for PT Re-Evaluation  07/05/18    Authorization Type  [employer]    PT Start Time  0800    PT Stop Time  0845    PT Time Calculation (min)  45 min    Equipment Utilized During Treatment  Gait belt    Activity Tolerance  Patient tolerated treatment well;Patient limited by pain    Behavior During Therapy  [facility] for tasks assessed/performed       Past Medical History:  Diagnosis Date  . Diabetic peripheral neuropathy (HCC)   . Gangrene of left foot (HCC)   . High cholesterol   . Hypertension   . OSA on CPAP   . Type II diabetes mellitus (HCC)     Past Surgical History:  Procedure Laterality Date  . AMPUTATION Left 12/25/2017   Procedure: LEFT BELOW KNEE AMPUTATION;  Surgeon: Nadara Mustard, MD;  Location: [facility] OR;  Service: Orthopedics;  Laterality: Left;  . HEEL SPUR EXCISION Bilateral 1992  . ROTATOR CUFF REPAIR Left 1990s    There were no vitals filed for this visit.   Subjective Assessment - 04/07/18 0807    Subjective  [name] was referred to PT on 03/31/2018 by Dr. Aldean Baker. He underwent a left Transtibial Amputation on 12/25/2017 due to gangrene. He received prosthesis 03/31/2018.    Pertinent History  LTTA, DM2, HTN, Hyperbilirubinemia, Hypoatremia,      Limitations  Standing;Lifting;Walking;House hold activities    Patient Stated Goals  Use prosthesis to walk in community, be active including fishing & hunting    Currently in Pain?  Yes    Pain Score  10-Worst pain ever   no residual limb pain when prosthesis off, phantom pain 10/10    Pain Location  Leg    Pain Orientation  Left    Pain Descriptors / Indicators  Aching;Sharp;Squeezing    Pain Type  Acute pain    Pain Onset  In the past 7 days    Pain Frequency  Intermittent    Aggravating Factors   wearing prosthesis    Pain Relieving Factors  taking prosthesis off for residual limb,          [facility] PT Assessment - 04/07/18 0800      Assessment   Medical Diagnosis  Left Transtibial Amputation    Referring Provider (PT)  Aldean Baker, MD    Onset Date/Surgical Date  03/31/18   prosthesis delivery   Hand Dominance  Right    Prior Therapy  [facility]       Precautions   Precautions  Fall      Balance Screen   Has the patient fallen in the past 6 months  Yes    How many times?  reports daily    Has the patient had a decrease in activity level because of a fear of falling?   Yes    Is the patient reluctant to leave their home because of a fear of falling?   Yes      Home Environment   Living Environment  Private residence    Living Arrangements  Alone    Type of Home  House    Home Access  Stairs to enter    Entrance Stairs-Number of Steps  4    Entrance Stairs-Rails  None    Home Layout  One level    Home Equipment  Wheelchair - manual;Crutches;Shower seat - built in      Prior Function   Level of Independence  Independent;Independent with household mobility without device;Independent with community mobility without device   active outdoors   Vocation  Retired    Leisure  hunting, fishing      Posture/Postural Control   Posture/Postural Control  Postural limitations    Postural Limitations  Rounded Shoulders;Forward head;Flexed trunk;Weight shift right      ROM / Strength   AROM / PROM / Strength  AROM;Strength      AROM   Overall AROM   Within functional limits for tasks performed      Strength   Overall Strength  Within functional limits for tasks performed      Transfers   Transfers  Sit to Stand;Stand to Sit    Sit to Stand  5:  Supervision;With upper extremity assist;With armrests;From chair/3-in-1    Stand to Sit  5: Supervision;With upper extremity assist;With armrests;To chair/3-in-1      Ambulation/Gait   Ambulation/Gait  Yes    Ambulation/Gait Assistance  4: Min assist    Ambulation/Gait Assistance Details  residual limb pain from increasing weight bearing on prosthesis too rapidly    Ambulation Distance (Feet)  100 Feet    Assistive device  Prosthesis;None    Gait Pattern  Step-through pattern;Decreased arm swing - left;Decreased stance time - left;Decreased step length - right;Decreased stride length;Decreased hip/knee flexion - left;Decreased weight shift to left;Left hip hike;Left flexed knee in stance;Trendelenburg;Antalgic;Lateral hip instability;Trunk flexed;Abducted - left    Ambulation Surface  Level;Indoor    Gait velocity  1.12 ft/sec      Standardized Balance Assessment   Standardized Balance Assessment  Berg Balance Test      Berg Balance Test   Sit to Stand  Able to stand  independently using hands    Standing Unsupported  Able to stand 2 minutes with supervision    Sitting with Back Unsupported but Feet Supported on Floor or Stool  Able to sit safely and securely 2 minutes    Stand to Sit  Controls descent by using hands    Transfers  Able to transfer safely, definite need of hands    Standing Unsupported with Eyes Closed  Able to stand 10 seconds with supervision    Standing Ubsupported with Feet Together  Able to place feet together independently but unable to hold for 30 seconds    From Standing, Reach Forward with Outstretched Arm  Can reach forward >5 cm safely (2")    From Standing Position, Pick up Object from Floor  Able to pick up shoe, needs supervision    From Standing Position, Turn to Look Behind Over each Shoulder  Needs supervision when turning    Turn 360 Degrees  Needs assistance while turning    Standing Unsupported, Alternately Place Feet on Step/Stool  Able to complete >2  steps/needs minimal assist    Standing Unsupported, One Foot in Front  Needs help to step but can hold 15 seconds    Standing on One Leg  Able to lift leg independently and hold equal to or more than 3 seconds  Total Score  31      Functional Gait  Assessment   Gait assessed   Yes    Gait Level Surface  Walks 20 ft, slow speed, abnormal gait pattern, evidence for imbalance or deviates 10-15 in outside of the 12 in walkway width. Requires more than 7 sec to ambulate 20 ft.    Change in Gait Speed  Makes only minor adjustments to walking speed, or accomplishes a change in speed with significant gait deviations, deviates 10-15 in outside the 12 in walkway width, or changes speed but loses balance but is able to recover and continue walking.    Gait with Horizontal Head Turns  Performs head turns with moderate changes in gait velocity, slows down, deviates 10-15 in outside 12 in walkway width but recovers, can continue to walk.    Gait with Vertical Head Turns  Performs task with moderate change in gait velocity, slows down, deviates 10-15 in outside 12 in walkway width but recovers, can continue to walk.    Gait and Pivot Turn  Turns slowly, requires verbal cueing, or requires several small steps to catch balance following turn and stop    Step Over Obstacle  Is able to step over one shoe box (4.5 in total height) but must slow down and adjust steps to clear box safely. May require verbal cueing.    Gait with Narrow Base of Support  Ambulates less than 4 steps heel to toe or cannot perform without assistance.    Gait with Eyes Closed  Walks 20 ft, slow speed, abnormal gait pattern, evidence for imbalance, deviates 10-15 in outside 12 in walkway width. Requires more than 9 sec to ambulate 20 ft.    Ambulating Backwards  Walks 20 ft, slow speed, abnormal gait pattern, evidence for imbalance, deviates 10-15 in outside 12 in walkway width.    Steps  Two feet to a stair, must use rail.    Total Score  9       Prosthetics Assessment - 04/07/18 0800      Prosthetics   Prosthetic Care Dependent with  Skin check;Residual limb care;Care of non-amputated limb;Prosthetic cleaning;Ply sock cleaning;Correct ply sock adjustment;Proper wear schedule/adjustment;Proper weight-bearing schedule/adjustment    Donning prosthesis   Supervision    Doffing prosthesis   Supervision    Current prosthetic wear tolerance (days/week)   wore prosthesis 2 of 7 days since delivery    Current prosthetic wear tolerance (#hours/day)   reports wear 1 hr    Current prosthetic weight-bearing tolerance (hours/day)   Patient has 10/10 pain with standing & gait with prosthesis    Edema  pitting edema    Residual limb condition   4 scabs on incision with no drainage, no signs of infection, dry skin, redness distally, cylinderical shape,     K code/activity level with prosthetic use   K3 full community with variable cadence.  Prosthesis is silicon liner with secondary pelite liner. He has too much edema that he was unable to donne secondary pelite liner.                Objective measurements completed on examination: See above findings.      [facility] Adult PT Treatment/Exercise - 04/07/18 0800      Prosthetics   Prosthetic Care Comments   PT recommended wear 2 hrs 2x/day with increase weekly if no increase in wound issues or limb pain.     Education Provided  Skin check;Residual limb care;Prosthetic cleaning;Correct ply sock adjustment;Proper Donning;Proper  wear schedule/adjustment    Person(s) Educated  Patient    Education Method  Explanation;Demonstration;Tactile cues;Verbal cues    Education Method  Verbalized understanding;Returned demonstration;Tactile cues required;Verbal cues required;Needs further instruction               PT Short Term Goals - 04/07/18 2150      PT SHORT TERM GOAL #1   Title  Patient tolerates prostheis wear >8hrs total / day without increase in skin issues. (All STGs Target Date:  05/07/2018)    Time  1    Period  Months    Status  New    Target Date  05/07/18      PT SHORT TERM GOAL #2   Title  Patient demonstrates proper donning and verbalizes proper cleaning of prosthesis.     Time  1    Period  Months    Status  New    Target Date  05/07/18      PT SHORT TERM GOAL #3   Title  Patient able to reach 5", pick up objects from floor and looks over shoulders without UE support with no loss of balance.     Time  1    Period  Months    Status  New    Target Date  05/07/18      PT SHORT TERM GOAL #4   Title  Patient ambulates 300' with cane & prosthesis with supervision.     Time  1    Period  Months    Status  New    Target Date  05/07/18      PT SHORT TERM GOAL #5   Title  Patient negotiates ramps, curbs and stairs with one rail with cane with supervision.     Time  1    Period  Months    Status  New    Target Date  05/07/18        PT Long Term Goals - 04/07/18 2144      PT LONG TERM GOAL #1   Title  Patient tolerates wear of prosthesis >90% of awake hours to without skin issues and limb pain <5/10 to enable function throughout his day. (All LTGs Target Date: 07/02/2018)    Time  3    Period  Months    Status  New    Target Date  07/02/18      PT LONG TERM GOAL #2   Title  Patient verbalizes & demonstrates proper prosthetic care to enable safe use of prosthesis.     Time  3    Period  Months    Status  New    Target Date  07/02/18      PT LONG TERM GOAL #3   Title  Berg Balance >45/56 to indicate lower fall risk.     Time  3    Period  Months    Status  New    Target Date  07/02/18      PT LONG TERM GOAL #4   Title  Patient ambulates 1000' outdoors including grass, ramps, curbs & stairs with prosthesis only modified independent to enable community access.     Time  3    Period  Months    Status  New    Target Date  07/02/18      PT LONG TERM GOAL #5   Title  Functional Gait Assessment >/= 19/30 to indicate lower fall risk.      Time  3  Period  Months    Status  New    Target Date  07/02/18      Additional Long Term Goals   Additional Long Term Goals  Yes      PT LONG TERM GOAL #6   Title  Patient demonstrates proper technique to lift & carry 25# box, push, pull and climb ladders to enable return to prior activities which is his goal.     Time  3    Period  Months    Status  New    Target Date  07/02/18             Plan - 04/07/18 2133    Clinical Impression Statement  [name] underwent a left Transtibial Amputation on 12/25/2017 and he received his prosthesis on 03/31/2018. He has worn prosthesis 2 times for 1 hr since delivery which limits his function. He has residual limb pain 10/10 with standing & gait. He has deconditioning from limited function. He is dependent in prosthetic care and has wound /scabs on residual limb which risk increased issues with improper use. His balance is impaired as noted by [employer] 31/56 indicating high fall risk and dependency in standing ADLs. His gait is dependent requiring minimal assist, gait velocity 1.12 ft/sec and deviations indicating high fall risk. Functional Gait Assessment 9/30 also indicates high fall risk. Patient would benefit from skilled PT to improve function & safety.     History and Personal Factors relevant to plan of care:  LTTA, DM2, HTN, Hyperbilirubinemia, Hypoatremia,      Clinical Presentation  Evolving    Clinical Presentation due to:  high fall risk, wounds on residual limb with diabetes, dependency in prosthetic care    Clinical Decision Making  Moderate    Rehab Potential  Good    PT Frequency  2x / week    PT Duration  Other (comment)   13 weeks (90 days)   PT Treatment/Interventions  ADLs/Self Care Home Management;Canalith Repostioning;DME Instruction;Gait training;Stair training;Functional mobility training;Therapeutic activities;Therapeutic exercise;Balance training;Neuromuscular re-education;Patient/family education;Prosthetic  Training;Manual techniques;Scar mobilization;Vestibular    PT Next Visit Plan  review prosthetic care, instruct in adjusting ply socks, prosthetic gait with cane including barriers    Consulted and Agree with Plan of Care  Patient       Patient will benefit from skilled therapeutic intervention in order to improve the following deficits and impairments:  Abnormal gait, Decreased balance, Decreased mobility, Decreased skin integrity, Decreased strength, Dizziness, Impaired flexibility, Postural dysfunction, Prosthetic Dependency, Pain  Visit Diagnosis: Pain in left leg  Abnormal posture  Unsteadiness on feet  Other abnormalities of gait and mobility  Muscle weakness (generalized)     Problem List Patient Active Problem List   Diagnosis Date Noted  . Below-knee amputation of left lower extremity (HCC)   . Postoperative pain   . Type II diabetes mellitus (HCC) 12/22/2017  . Essential hypertension 12/22/2017  . Renal insufficiency, mild 12/22/2017  . Hyponatremia 12/22/2017  . Hyperbilirubinemia 12/22/2017  . Severe protein-calorie malnutrition (HCC)   . Diabetic polyneuropathy associated with type 2 diabetes mellitus (HCC)     Donald [name] PT, DPT 04/07/2018, 9:55 PM  [facility] [address] [place], [place], [address] Phone: [phone]   Fax:  [phone]  Name: Donald [name] MRN: [id number] Date of Birth: [birth year]

## 2018-04-08 ENCOUNTER — Encounter: Payer: Self-pay | Admitting: Rehabilitation

## 2018-04-08 ENCOUNTER — Ambulatory Visit: Payer: Medicare Other | Admitting: Rehabilitation

## 2018-04-08 DIAGNOSIS — M79605 Pain in left leg: Secondary | ICD-10-CM | POA: Diagnosis not present

## 2018-04-08 DIAGNOSIS — R2689 Other abnormalities of gait and mobility: Secondary | ICD-10-CM

## 2018-04-08 DIAGNOSIS — M6281 Muscle weakness (generalized): Secondary | ICD-10-CM

## 2018-04-08 DIAGNOSIS — R293 Abnormal posture: Secondary | ICD-10-CM

## 2018-04-08 DIAGNOSIS — R2681 Unsteadiness on feet: Secondary | ICD-10-CM

## 2018-04-08 NOTE — Therapy (Signed)
[facility] [address] [place], [place], [address] Phone: [phone]   Fax:  [phone]  Physical Therapy Treatment  Patient Details  Name: Donald [name] MRN: [id number] Date of Birth: 05/16/1951 Referring Provider (PT): Aldean BakerMarcus Duda, MD   Encounter Date: 04/08/2018  PT End of Session - 04/08/18 1616    Visit Number  2    Number of Visits  25    Date for PT Re-Evaluation  07/05/18    Authorization Type  [employer]    PT Start Time  1114   pt late to session   PT Stop Time  1147    PT Time Calculation (min)  33 min    Equipment Utilized During Treatment  Gait belt    Activity Tolerance  Patient tolerated treatment well;Patient limited by pain    Behavior During Therapy  [facility] for tasks assessed/performed       Past Medical History:  Diagnosis Date  . Diabetic peripheral neuropathy (HCC)   . Gangrene of left foot (HCC)   . High cholesterol   . Hypertension   . OSA on CPAP   . Type II diabetes mellitus (HCC)     Past Surgical History:  Procedure Laterality Date  . AMPUTATION Left 12/25/2017   Procedure: LEFT BELOW KNEE AMPUTATION;  Surgeon: Nadara Mustarduda, Marcus V, MD;  [facility];  Service: Orthopedics;  Laterality: Left;  . HEEL SPUR EXCISION Bilateral 1992  . ROTATOR CUFF REPAIR Left 1990s    There were no vitals filed for this visit.  Subjective Assessment - 04/08/18 1509    Subjective  Pt presents late to session today, wearing prosthesis without inner liner, but with a 3 and 1 ply sock.  Continues to have elevated pain today.     Pertinent History  LTTA, DM2, HTN, Hyperbilirubinemia, Hypoatremia,      Limitations  Standing;Lifting;Walking;House hold activities    Patient Stated Goals  Use prosthesis to walk in community, be active including fishing & hunting    Currently in Pain?  Yes    Pain Score  8     Pain Location  Leg    Pain Orientation  Left    Pain Descriptors / Indicators   Aching;Sharp;Squeezing    Pain Type  Acute pain    Pain Onset  In the past 7 days    Pain Frequency  Intermittent    Aggravating Factors   wearing prosthesis    Pain Relieving Factors  taking prosthesis off                       [facility] Adult PT Treatment/Exercise - 04/08/18 1114      Ambulation/Gait   Ambulation/Gait  Yes    Ambulation/Gait Assistance  5: Supervision;4: Min guard    Ambulation/Gait Assistance Details  Cues for improved L hip activation and landing smoother on LLE during stance phase of gait.  Cues for decreased lateral movement of trunk.      Ambulation Distance (Feet)  200 Feet    Assistive device  Prosthesis;None    Gait Pattern  Step-through pattern;Decreased arm swing - left;Decreased stance time - left;Decreased step length - right;Decreased stride length;Decreased hip/knee flexion - left;Decreased weight shift to left;Left hip hike;Left flexed knee in stance;Trendelenburg;Antalgic;Lateral hip instability;Trunk flexed;Abducted - left    Ambulation Surface  Level;Indoor    Gait Comments  Had pt ambulate for 2 very short bouts of 15-20' as ply socks were being adjusted to  appropriate amount       High Level Balance   High Level Balance Activities  Backward walking;Marching forwards    High Level Balance Comments  Worked on high level balance at counter top with marching forwards followed by walking backwards x 4 laps each with very light UE support with cues for improved L lateral weight shift, esp with backwards walking.  Progressed balance tasks in // bars standing on small rocker board biased horizontally working to maintain equal weight bearing.  Note increased R lateral weight shift but pt able to correct with cues.  Progressed to performing head motions up/down and side side x 10 reps each with feet apart, again with cues for improved R lateral weight shift.  Ended on rocker board with feet apart and eyes closed.  Pt tends to rely on UEs to correct  balance, but did improve with repetition.  Standing on foam airex stepping forward alternating LEs x 10 reps each ending with alternating cone tapping x 10 reps each       Prosthetics   Prosthetic Care Comments   PT recommended wear 2 hrs 2x/day with increase weekly if no increase in wound issues or limb pain.     Current prosthetic wear tolerance (days/week)   Is wearing daily, but with increased residual limb pain    Current prosthetic wear tolerance (#hours/day)   2hrs 2x/day    Current prosthetic weight-bearing tolerance (hours/day)   Pt continued to have increased pain at beginning of session, however with added ply socks, had decreased pain    Edema  pitting edema    Residual limb condition   4 scabs on incision with no drainage, no signs of infection, dry skin, redness distally, cylinderical shape,     Education Provided  Skin check;Residual limb care;Correct ply sock adjustment;Proper wear schedule/adjustment    Person(s) Educated  Patient    Education Method  Explanation;Demonstration;Verbal cues    Education Method  Verbalized understanding;Verbal cues required;Needs further instruction             PT Education - 04/08/18 1615    Education Details  see prosthetic care section     Person(s) Educated  Patient    Methods  Explanation    Comprehension  Verbalized understanding;Need further instruction       PT Short Term Goals - 04/07/18 2150      PT SHORT TERM GOAL #1   Title  Patient tolerates prostheis wear >8hrs total / day without increase in skin issues. (All STGs Target Date: 05/07/2018)    Time  1    Period  Months    Status  New    Target Date  05/07/18      PT SHORT TERM GOAL #2   Title  Patient demonstrates proper donning and verbalizes proper cleaning of prosthesis.     Time  1    Period  Months    Status  New    Target Date  05/07/18      PT SHORT TERM GOAL #3   Title  Patient able to reach 5", pick up objects from floor and looks over shoulders  without UE support with no loss of balance.     Time  1    Period  Months    Status  New    Target Date  05/07/18      PT SHORT TERM GOAL #4   Title  Patient ambulates 300' with cane & prosthesis with supervision.  Time  1    Period  Months    Status  New    Target Date  05/07/18      PT SHORT TERM GOAL #5   Title  Patient negotiates ramps, curbs and stairs with one rail with cane with supervision.     Time  1    Period  Months    Status  New    Target Date  05/07/18        PT Long Term Goals - 04/07/18 2144      PT LONG TERM GOAL #1   Title  Patient tolerates wear of prosthesis >90% of awake hours to without skin issues and limb pain <5/10 to enable function throughout his day. (All LTGs Target Date: 07/02/2018)    Time  3    Period  Months    Status  New    Target Date  07/02/18      PT LONG TERM GOAL #2   Title  Patient verbalizes & demonstrates proper prosthetic care to enable safe use of prosthesis.     Time  3    Period  Months    Status  New    Target Date  07/02/18      PT LONG TERM GOAL #3   Title  Berg Balance >45/56 to indicate lower fall risk.     Time  3    Period  Months    Status  New    Target Date  07/02/18      PT LONG TERM GOAL #4   Title  Patient ambulates 1000' outdoors including grass, ramps, curbs & stairs with prosthesis only modified independent to enable community access.     Time  3    Period  Months    Status  New    Target Date  07/02/18      PT LONG TERM GOAL #5   Title  Functional Gait Assessment >/= 19/30 to indicate lower fall risk.     Time  3    Period  Months    Status  New    Target Date  07/02/18      Additional Long Term Goals   Additional Long Term Goals  Yes      PT LONG TERM GOAL #6   Title  Patient demonstrates proper technique to lift & carry 25# box, push, pull and climb ladders to enable return to prior activities which is his goal.     Time  3    Period  Months    Status  New    Target Date  07/02/18             Plan - 04/08/18 1616    Clinical Impression Statement  Pt limited this session due to being late and having continued residual limb pain.  He is still unable to get inner liner on with prosthesis and was wearing only a 3 and 1 ply socks with prosthesis.  He had increased pain and note area of pressure at bottom of residual limb, therefore provided max education on need to increase ply sock to distribute pressure and how fluid can fluctuate during the day, esp as he begins to wear the leg more.  PT recommended 10 ply socks today (2, 5's) to allow proper pressure throughout residual limb.  Pt reports marked improvement in pain during session.     Rehab Potential  Good    PT Frequency  2x / week    PT  Duration  Other (comment)   13 weeks (90 days)   PT Treatment/Interventions  ADLs/Self Care Home Management;Canalith Repostioning;DME Instruction;Gait training;Stair training;Functional mobility training;Therapeutic activities;Therapeutic exercise;Balance training;Neuromuscular re-education;Patient/family education;Prosthetic Training;Manual techniques;Scar mobilization;Vestibular    PT Next Visit Plan  Continue to educate on ply sock adjustment and prosthetic care.  Get pt to buy in with PT, what interests him?    Consulted and Agree with Plan of Care  Patient       Patient will benefit from skilled therapeutic intervention in order to improve the following deficits and impairments:  Abnormal gait, Decreased balance, Decreased mobility, Decreased skin integrity, Decreased strength, Dizziness, Impaired flexibility, Postural dysfunction, Prosthetic Dependency, Pain  Visit Diagnosis: Abnormal posture  Unsteadiness on feet  Other abnormalities of gait and mobility  Muscle weakness (generalized)  Pain in left leg     Problem List Patient Active Problem List   Diagnosis Date Noted  . Below-knee amputation of left lower extremity (HCC)   . Postoperative pain   . Type II  diabetes mellitus (HCC) 12/22/2017  . Essential hypertension 12/22/2017  . Renal insufficiency, mild 12/22/2017  . Hyponatremia 12/22/2017  . Hyperbilirubinemia 12/22/2017  . Severe protein-calorie malnutrition (HCC)   . Diabetic polyneuropathy associated with type 2 diabetes mellitus (HCC)     Harriet Butte, PT, MPT [facility] [address] [place], [place], [address] Phone: [phone]   Fax:  [phone] 04/08/18, 4:20 PM  Name: Donald [name] MRN: [id number] Date of Birth: 1951/05/19

## 2018-04-12 ENCOUNTER — Encounter: Payer: Self-pay | Admitting: Physical Therapy

## 2018-04-12 ENCOUNTER — Encounter (INDEPENDENT_AMBULATORY_CARE_PROVIDER_SITE_OTHER): Payer: Self-pay | Admitting: Orthopedic Surgery

## 2018-04-12 ENCOUNTER — Ambulatory Visit: Payer: Medicare Other | Admitting: Physical Therapy

## 2018-04-12 ENCOUNTER — Ambulatory Visit (INDEPENDENT_AMBULATORY_CARE_PROVIDER_SITE_OTHER): Payer: Medicare Other | Admitting: Orthopedic Surgery

## 2018-04-12 VITALS — Ht 65.0 in | Wt 153.9 lb

## 2018-04-12 DIAGNOSIS — R293 Abnormal posture: Secondary | ICD-10-CM

## 2018-04-12 DIAGNOSIS — M79605 Pain in left leg: Secondary | ICD-10-CM | POA: Diagnosis not present

## 2018-04-12 DIAGNOSIS — S88112A Complete traumatic amputation at level between knee and ankle, left lower leg, initial encounter: Secondary | ICD-10-CM

## 2018-04-12 DIAGNOSIS — R2689 Other abnormalities of gait and mobility: Secondary | ICD-10-CM

## 2018-04-12 DIAGNOSIS — R2681 Unsteadiness on feet: Secondary | ICD-10-CM

## 2018-04-12 DIAGNOSIS — M6281 Muscle weakness (generalized): Secondary | ICD-10-CM

## 2018-04-12 DIAGNOSIS — Z89512 Acquired absence of left leg below knee: Secondary | ICD-10-CM

## 2018-04-12 NOTE — Therapy (Signed)
[facility] [address] [place], [place], [address] Phone: [phone]   Fax:  [phone]  Physical Therapy Treatment  Patient Details  Name: Donald [name] MRN: [id number] Date of Birth: [birth year] Referring Provider (PT): Aldean Baker, MD   Encounter Date: 04/12/2018  PT End of Session - 04/12/18 1048    Visit Number  3    Number of Visits  25    Date for PT Re-Evaluation  07/05/18    Authorization Type  [employer]na Medicare    PT Start Time  1018    PT Stop Time  1103    PT Time Calculation (min)  45 min       Past Medical History:  Diagnosis Date  . Diabetic peripheral neuropathy (HCC)   . Gangrene of left foot (HCC)   . High cholesterol   . Hypertension   . OSA on CPAP   . Type II diabetes mellitus (HCC)     Past Surgical History:  Procedure Laterality Date  . AMPUTATION Left 12/25/2017   Procedure: LEFT BELOW KNEE AMPUTATION;  Surgeon: Nadara Mustard, MD;  Location: [facility] OR;  Service: Orthopedics;  Laterality: Left;  . HEEL SPUR EXCISION Bilateral 1992  . ROTATOR CUFF REPAIR Left 1990s    There were no vitals filed for this visit.  Subjective Assessment - 04/12/18 1020    Subjective  No falls. Continues to have elevated pain with donning liner and prosthesis; plans to see prosthetist today.  Did not bring in liner to therapy because he can't get it on.    Pertinent History  LTTA, DM2, HTN, Hyperbilirubinemia, Hypoatremia,      Limitations  Standing;Lifting;Walking;House hold activities    Patient Stated Goals  Use prosthesis to walk in community, be active including fishing & hunting    Currently in Pain?  Yes    Pain Score  10-Worst pain ever    Pain Location  Leg    Pain Orientation  Left    Pain Descriptors / Indicators  Aching;Throbbing    Pain Onset  In the past 7 days                       [facility] Adult PT Treatment/Exercise - 04/12/18 0001      Ambulation/Gait   Ambulation/Gait   Yes    Ambulation/Gait Assistance  5: Supervision    Ambulation/Gait Assistance Details  working on balance with head turns    Ambulation Distance (Feet)  400 Feet    Assistive device  Prosthesis;None    Gait Pattern  Step-through pattern;Decreased arm swing - left;Decreased stance time - left;Decreased step length - right;Decreased stride length;Decreased hip/knee flexion - left;Decreased weight shift to left;Left hip hike;Left flexed knee in stance;Trendelenburg;Antalgic;Lateral hip instability;Trunk flexed;Abducted - left    Ambulation Surface  Level;Indoor      Exercises   Exercises  Knee/Hip      Knee/Hip Exercises: Aerobic   Other Aerobic  Scifit seated stepper Level 1, 10 min, all extremities       Prosthetics   Prosthetic Care Comments   Recommend pt bring in extra ply socks into gym so there can be adjustments made during  PT session if needed. Was able to go out to pts car and get bag of ply socks and liner. Evaluating PT recommend pt ask prosthetist for different shrinker that may be more effective at night.  Current prosthetic wear tolerance (days/week)   Is wearing daily, but with increased residual limb pain    Current prosthetic wear tolerance (#hours/day)   2hrs 2x/day    Current prosthetic weight-bearing tolerance (hours/day)   attempted to assist pt with donning inner liner and residual limb in unable to fit into it at this time.                              Edema  pitting edema    Residual limb condition   4 scabs on incision with no drainage, no signs of infection, dry skin, redness distally, cylinderical shape,     Education Provided  Skin check;Residual limb care;Correct ply sock adjustment;Proper wear schedule/adjustment    Person(s) Educated  Patient    Education Method  Explanation;Demonstration;Verbal cues    Education Method  Verbalized understanding;Returned demonstration;Needs further instruction;Verbal cues required           Balance Exercises - 04/12/18 1317      Balance Exercises: Standing   Sidestepping  4 reps   intermittent UE support   Marching Limitations  Intermittent UE support, cues to keep feet apart vs. scissoring        PT Education - 04/12/18 1046    Education Details  Discussed benefits and options for cardiovascular fitness.    Person(s) Educated  Patient    Methods  Explanation;Demonstration;Verbal cues    Comprehension  Verbalized understanding;Returned demonstration;Need further instruction;Verbal cues required       PT Short Term Goals - 04/07/18 2150      PT SHORT TERM GOAL #1   Title  Patient tolerates prostheis wear >8hrs total / day without increase in skin issues. (All STGs Target Date: 05/07/2018)    Time  1    Period  Months    Status  New    Target Date  05/07/18      PT SHORT TERM GOAL #2   Title  Patient demonstrates proper donning and verbalizes proper cleaning of prosthesis.     Time  1    Period  Months    Status  New    Target Date  05/07/18      PT SHORT TERM GOAL #3   Title  Patient able to reach 5", pick up objects from floor and looks over shoulders without UE support with no loss of balance.     Time  1    Period  Months    Status  New    Target Date  05/07/18      PT SHORT TERM GOAL #4   Title  Patient ambulates 300' with cane & prosthesis with supervision.     Time  1    Period  Months    Status  New    Target Date  05/07/18      PT SHORT TERM GOAL #5   Title  Patient negotiates ramps, curbs and stairs with one rail with cane with supervision.     Time  1    Period  Months    Status  New    Target Date  05/07/18        PT Long Term Goals - 04/07/18 2144      PT LONG TERM GOAL #1   Title  Patient tolerates wear of prosthesis >90% of awake hours to without skin issues and limb pain <5/10 to enable function throughout his day. (All LTGs Target Date: 07/02/2018)  Time  3    Period  Months    Status  New    Target Date   07/02/18      PT LONG TERM GOAL #2   Title  Patient verbalizes & demonstrates proper prosthetic care to enable safe use of prosthesis.     Time  3    Period  Months    Status  New    Target Date  07/02/18      PT LONG TERM GOAL #3   Title  Berg Balance >45/56 to indicate lower fall risk.     Time  3    Period  Months    Status  New    Target Date  07/02/18      PT LONG TERM GOAL #4   Title  Patient ambulates 1000' outdoors including grass, ramps, curbs & stairs with prosthesis only modified independent to enable community access.     Time  3    Period  Months    Status  New    Target Date  07/02/18      PT LONG TERM GOAL #5   Title  Functional Gait Assessment >/= 19/30 to indicate lower fall risk.     Time  3    Period  Months    Status  New    Target Date  07/02/18      Additional Long Term Goals   Additional Long Term Goals  Yes      PT LONG TERM GOAL #6   Title  Patient demonstrates proper technique to lift & carry 25# box, push, pull and climb ladders to enable return to prior activities which is his goal.     Time  3    Period  Months    Status  New    Target Date  07/02/18            Plan - 04/12/18 1323    Clinical Impression Statement  Pt plans to go to prothetist after this appointment today.  Pt continued to have the same issues with pain and not being able to get liner on as last session.  Continued to educate pt on limb and prosthetic care, cardiovascular training, and dynamic gait training with prosthesis; pt able to perform all activity but still reported elevated residual limb pain.                                                  Rehab Potential  Good    PT Frequency  2x / week    PT Duration  Other (comment)   13 weeks (90 days)   PT Treatment/Interventions  ADLs/Self Care Home Management;Canalith Repostioning;DME Instruction;Gait training;Stair training;Functional mobility training;Therapeutic activities;Therapeutic exercise;Balance  training;Neuromuscular re-education;Patient/family education;Prosthetic Training;Manual techniques;Scar mobilization;Vestibular    PT Next Visit Plan  Continue to educate on ply sock adjustment and prosthetic care.  balance, gait, and cardio training.    Consulted and Agree with Plan of Care  Patient       Patient will benefit from skilled therapeutic intervention in order to improve the following deficits and impairments:  Abnormal gait, Decreased balance, Decreased mobility, Decreased skin integrity, Decreased strength, Dizziness, Impaired flexibility, Postural dysfunction, Prosthetic Dependency, Pain  Visit Diagnosis: Abnormal posture  Unsteadiness on feet  Other abnormalities of gait and mobility  Muscle weakness (generalized)  Pain in  left leg     Problem List Patient Active Problem List   Diagnosis Date Noted  . Below-knee amputation of left lower extremity (HCC)   . Postoperative pain   . Type II diabetes mellitus (HCC) 12/22/2017  . Essential hypertension 12/22/2017  . Renal insufficiency, mild 12/22/2017  . Hyponatremia 12/22/2017  . Hyperbilirubinemia 12/22/2017  . Severe protein-calorie malnutrition (HCC)   . Diabetic polyneuropathy associated with type 2 diabetes mellitus (HCC)     Hortencia Conradi, PTA  04/12/18, 1:29 PM [facility] [address] [place], [place], [address] Phone: [phone]   Fax:  [phone]  Name: Ledarius Leeson MRN: [id number] Date of Birth: 07/02/1950

## 2018-04-12 NOTE — Progress Notes (Signed)
Office Visit Note   Patient: Donald [name]           Date of Birth: 07/03/1950           MRN: [id number] Visit Date: 04/12/2018              Requested by: Claybon JabsBrown, Emily, PA-C [address] [place], [place] [address] PCP: Claybon JabsBrown, Emily, PA-C  Chief Complaint  Patient presents with  . Left Leg - Routine Post Op    Left BKA f/u      HPI: Patient is a 67 year old gentleman who is 3 and half months status post left transtibial amputation he states he has increased edema he states even in the morning he has difficulty getting the prosthesis on.  He states the tightness does cause pain he has been to to physical therapy sessions.  Prosthesis fabricated by [employer].  Assessment & Plan: Visit Diagnoses:  1. Below-knee amputation of left lower extremity (HCC)     Plan: Patient will follow with [employer] after he is proceed with the least 3 physical therapy sessions.  Patient may need reevaluation for revision of the socket.  Follow-Up Instructions: Return in about 4 weeks (around 05/10/2018).   Ortho Exam  Patient is alert, oriented, no adenopathy, well-dressed, normal affect, normal respiratory effort. Examination patient has good hair growth there are no ulcers no cellulitis clinically there does not appear to be any swelling.  There is no pitting edema no skin color or temperature changes.  Imaging: No results found. No images are attached to the encounter.  Labs: Lab Results  Component Value Date   HGBA1C 5.6 12/22/2017   ESRSEDRATE 129 (H) 12/22/2017   CRP 26.8 (H) 12/22/2017   REPTSTATUS 12/27/2017 FINAL 12/21/2017   CULT  12/21/2017    NO GROWTH 5 DAYS Performed at [facility], [address] [place], [place] [address]      Lab Results  Component Value Date   ALBUMIN 1.7 (L) 12/26/2017   ALBUMIN 1.7 (L) 12/25/2017   ALBUMIN 1.7 (L) 12/24/2017   PREALBUMIN <5 (L) 12/22/2017    Body mass index is 25.61 kg/m.  Orders:  No orders of the defined types  were placed in this encounter.  No orders of the defined types were placed in this encounter.    Procedures: No procedures performed  Clinical Data: No additional findings.  ROS:  All other systems negative, except as noted in the HPI. Review of Systems  Objective: Vital Signs: Ht 5\' 5"  (1.651 m)   Wt 153 lb 14.1 oz (69.8 kg)   BMI 25.61 kg/m   Specialty Comments:  No specialty comments available.  PMFS History: Patient Active Problem List   Diagnosis Date Noted  . Below-knee amputation of left lower extremity (HCC)   . Postoperative pain   . Type II diabetes mellitus (HCC) 12/22/2017  . Essential hypertension 12/22/2017  . Renal insufficiency, mild 12/22/2017  . Hyponatremia 12/22/2017  . Hyperbilirubinemia 12/22/2017  . Severe protein-calorie malnutrition (HCC)   . Diabetic polyneuropathy associated with type 2 diabetes mellitus (HCC)    Past Medical History:  Diagnosis Date  . Diabetic peripheral neuropathy (HCC)   . Gangrene of left foot (HCC)   . High cholesterol   . Hypertension   . OSA on CPAP   . Type II diabetes mellitus (HCC)     History reviewed. No pertinent family history.  Past Surgical History:  Procedure Laterality Date  . AMPUTATION Left 12/25/2017   Procedure:  LEFT BELOW KNEE AMPUTATION;  Surgeon: Nadara Mustard, MD;  Location: [facility] OR;  Service: Orthopedics;  Laterality: Left;  . HEEL SPUR EXCISION Bilateral 1992  . ROTATOR CUFF REPAIR Left 1990s   Social History   Occupational History  . Not on file  Tobacco Use  . Smoking status: Never Smoker  . Smokeless tobacco: Never Used  Substance and Sexual Activity  . Alcohol use: Not Currently    Frequency: Never  . Drug use: Never  . Sexual activity: Not Currently

## 2018-04-15 ENCOUNTER — Ambulatory Visit: Payer: Medicare Other | Admitting: Physical Therapy

## 2018-04-15 ENCOUNTER — Encounter: Payer: Self-pay | Admitting: Physical Therapy

## 2018-04-15 DIAGNOSIS — R2681 Unsteadiness on feet: Secondary | ICD-10-CM

## 2018-04-15 DIAGNOSIS — R2689 Other abnormalities of gait and mobility: Secondary | ICD-10-CM

## 2018-04-15 DIAGNOSIS — M79605 Pain in left leg: Secondary | ICD-10-CM | POA: Diagnosis not present

## 2018-04-15 DIAGNOSIS — M6281 Muscle weakness (generalized): Secondary | ICD-10-CM

## 2018-04-15 DIAGNOSIS — R293 Abnormal posture: Secondary | ICD-10-CM

## 2018-04-15 NOTE — Therapy (Signed)
[facility] [address] [place], [place], [address] Phone: [phone]   Fax:  [phone]  Physical Therapy Treatment  Patient Details  Name: Donald [name] MRN: [id number] Date of Birth: 06/12/1950 Referring Provider (PT): Aldean Baker, MD   Encounter Date: 04/15/2018  PT End of Session - 04/15/18 1243    Visit Number  4    Number of Visits  25    Date for PT Re-Evaluation  07/05/18    Authorization Type  [employer]    PT Start Time  0930    PT Stop Time  1015    PT Time Calculation (min)  45 min    Equipment Utilized During Treatment  Gait belt    Activity Tolerance  Patient tolerated treatment well;Patient limited by pain    Behavior During Therapy  [facility] for tasks assessed/performed       Past Medical History:  Diagnosis Date  . Diabetic peripheral neuropathy (HCC)   . Gangrene of left foot (HCC)   . High cholesterol   . Hypertension   . OSA on CPAP   . Type II diabetes mellitus (HCC)     Past Surgical History:  Procedure Laterality Date  . AMPUTATION Left 12/25/2017   Procedure: LEFT BELOW KNEE AMPUTATION;  Surgeon: Nadara Mustard, MD;  Location: [facility] OR;  Service: Orthopedics;  Laterality: Left;  . HEEL SPUR EXCISION Bilateral 1992  . ROTATOR CUFF REPAIR Left 1990s    There were no vitals filed for this visit.  Subjective Assessment - 04/15/18 0935    Subjective  No falls. He went to prosthetist on Tuesday and they gave him new shrinker as PT requested.     Pertinent History  LTTA, DM2, HTN, Hyperbilirubinemia, Hypoatremia,      Limitations  Standing;Lifting;Walking;House hold activities    Patient Stated Goals  Use prosthesis to walk in community, be active including fishing & hunting    Currently in Pain?  Yes    Pain Score  10-Worst pain ever    Pain Location  Leg    Pain Orientation  Left    Pain Descriptors / Indicators  Aching;Throbbing    Pain Type  Acute pain    Pain Onset  1 to 4  weeks ago    Pain Frequency  Intermittent    Aggravating Factors   wearing prosthesis     Pain Relieving Factors  taking prosthesis off                       [facility] Adult PT Treatment/Exercise - 04/15/18 0930      Ambulation/Gait   Ambulation/Gait  Yes    Ambulation/Gait Assistance  5: Supervision    Ambulation/Gait Assistance Details  verbal, visual & tactile cues on step width / not abducting    Ambulation Distance (Feet)  400 Feet    Assistive device  Prosthesis;None    Gait Pattern  Step-through pattern;Decreased arm swing - left;Decreased stance time - left;Decreased step length - right;Decreased stride length;Decreased hip/knee flexion - left;Decreased weight shift to left;Left hip hike;Left flexed knee in stance;Trendelenburg;Antalgic;Lateral hip instability;Trunk flexed;Abducted - left    Ambulation Surface  Indoor;Level      Therapeutic Activites    Therapeutic Activities  Lifting    Lifting  PT demo, instructed in lifting light wt objects & 15# box from floor. Pt return demo understanding with verbal cues.       Exercises   Exercises  Knee/Hip  Knee/Hip Exercises: Aerobic   Other Aerobic  --      Prosthetics   Prosthetic Care Comments   PT reviewed adjusting ply socks: arrived with 6-ply fit without 2nd pelite foam. PT donned with pelite liner with 1-ply fit but his limb was not seated in socket (too many ply); PT adjusted ply fit up to 13-ply at end of session without pelite liner. PT instructed not to try pelite liner unless wearing >15-py fit.  PT instructed in signs of sweating & need to dry limb/liner.  PT recommended increasing wear to 4 hrs 2x/day drying half way.     Current prosthetic wear tolerance (days/week)   daily    Current prosthetic wear tolerance (#hours/day)   2-3hrs 2x/day; increase to 4hrs 2x/day    Current prosthetic weight-bearing tolerance (hours/day)   --    Edema  pitting edema    Residual limb condition   4 scabs on incision  with no drainage, no signs of infection, dry skin, redness distally, cylinderical shape,     Education Provided  Skin check;Residual limb care;Correct ply sock adjustment;Proper wear schedule/adjustment    Person(s) Educated  Patient    Education Method  Explanation;Demonstration;Tactile cues;Verbal cues    Education Method  Verbalized understanding;Returned demonstration;Tactile cues required;Verbal cues required;Needs further instruction               PT Short Term Goals - 04/15/18 1243      PT SHORT TERM GOAL #1   Title  Patient tolerates prostheis wear >8hrs total / day without increase in skin issues. (All STGs Target Date: 05/07/2018)    Time  1    Period  Months    Status  On-going    Target Date  05/07/18      PT SHORT TERM GOAL #2   Title  Patient demonstrates proper donning and verbalizes proper cleaning of prosthesis.     Time  1    Period  Months    Status  On-going    Target Date  05/07/18      PT SHORT TERM GOAL #3   Title  Patient able to reach 5", pick up objects from floor and looks over shoulders without UE support with no loss of balance.     Time  1    Period  Months    Status  On-going    Target Date  05/07/18      PT SHORT TERM GOAL #4   Title  Patient ambulates 300' with cane & prosthesis with supervision.     Time  1    Period  Months    Status  On-going    Target Date  05/07/18      PT SHORT TERM GOAL #5   Title  Patient negotiates ramps, curbs and stairs with one rail with cane with supervision.     Time  1    Period  Months    Status  On-going    Target Date  05/07/18        PT Long Term Goals - 04/15/18 1244      PT LONG TERM GOAL #1   Title  Patient tolerates wear of prosthesis >90% of awake hours to without skin issues and limb pain <5/10 to enable function throughout his day. (All LTGs Target Date: 07/02/2018)    Time  3    Period  Months    Status  On-going    Target Date  07/02/18  PT LONG TERM GOAL #2   Title   Patient verbalizes & demonstrates proper prosthetic care to enable safe use of prosthesis.     Time  3    Period  Months    Status  On-going    Target Date  07/02/18      PT LONG TERM GOAL #3   Title  Berg Balance >45/56 to indicate lower fall risk.     Time  3    Period  Months    Status  On-going    Target Date  07/02/18      PT LONG TERM GOAL #4   Title  Patient ambulates 1000' outdoors including grass, ramps, curbs & stairs with prosthesis only modified independent to enable community access.     Time  3    Period  Months    Status  On-going    Target Date  07/02/18      PT LONG TERM GOAL #5   Title  Functional Gait Assessment >/= 19/30 to indicate lower fall risk.     Time  3    Period  Months    Status  On-going    Target Date  07/02/18      PT LONG TERM GOAL #6   Title  Patient demonstrates proper technique to lift & carry 25# box, push, pull and climb ladders to enable return to prior activities which is his goal.     Time  3    Period  Months    Status  On-going    Target Date  07/02/18            Plan - 04/15/18 1244    Clinical Impression Statement  Patient appears to understand adjusting ply socks better but limb too edematous for pelite liner still. Patient improved ability to pick up items from floor with skilled instruction. Gait instruction in not abducting improved his gait.     Rehab Potential  Good    PT Frequency  2x / week    PT Duration  Other (comment)   13 weeks (90 days)   PT Treatment/Interventions  ADLs/Self Care Home Management;Canalith Repostioning;DME Instruction;Gait training;Stair training;Functional mobility training;Therapeutic activities;Therapeutic exercise;Balance training;Neuromuscular re-education;Patient/family education;Prosthetic Training;Manual techniques;Scar mobilization;Vestibular    PT Next Visit Plan  review ply sock adjustment and prosthetic care. work towards [employer].  balance, gait, and cardio training.    Consulted and  Agree with Plan of Care  Patient       Patient will benefit from skilled therapeutic intervention in order to improve the following deficits and impairments:  Abnormal gait, Decreased balance, Decreased mobility, Decreased skin integrity, Decreased strength, Dizziness, Impaired flexibility, Postural dysfunction, Prosthetic Dependency, Pain  Visit Diagnosis: Abnormal posture  Unsteadiness on feet  Other abnormalities of gait and mobility  Muscle weakness (generalized)  Pain in left leg     Problem List Patient Active Problem List   Diagnosis Date Noted  . Below-knee amputation of left lower extremity (HCC)   . Postoperative pain   . Type II diabetes mellitus (HCC) 12/22/2017  . Essential hypertension 12/22/2017  . Renal insufficiency, mild 12/22/2017  . Hyponatremia 12/22/2017  . Hyperbilirubinemia 12/22/2017  . Severe protein-calorie malnutrition (HCC)   . Diabetic polyneuropathy associated with type 2 diabetes mellitus (HCC)     Remus Hagedorn PT, DPT 04/15/2018, 12:47 PM  [facility] [address] [place], [place], [address] Phone: [phone]   Fax:  [phone]  Name: Donald [name] MRN: [id number]  Date of Birth: 05-Feb-1951

## 2018-04-21 ENCOUNTER — Encounter: Payer: Self-pay | Admitting: Physical Therapy

## 2018-04-21 ENCOUNTER — Ambulatory Visit: Payer: Medicare Other | Admitting: Physical Therapy

## 2018-04-21 DIAGNOSIS — R2689 Other abnormalities of gait and mobility: Secondary | ICD-10-CM

## 2018-04-21 DIAGNOSIS — M79605 Pain in left leg: Secondary | ICD-10-CM

## 2018-04-21 DIAGNOSIS — M6281 Muscle weakness (generalized): Secondary | ICD-10-CM

## 2018-04-21 DIAGNOSIS — R293 Abnormal posture: Secondary | ICD-10-CM

## 2018-04-21 DIAGNOSIS — R2681 Unsteadiness on feet: Secondary | ICD-10-CM

## 2018-04-22 NOTE — Therapy (Signed)
[facility] [address] [place], [place], [address] Phone: [phone]   Fax:  [phone]  Physical Therapy Treatment  Patient Details  Name: Donald GougeMichael Brunetti MRN: [id number] Date of Birth: 06/17/1950 Referring Provider (PT): Aldean BakerMarcus Duda, MD   Encounter Date: 04/21/2018  PT End of Session - 04/21/18 1500    Visit Number  5    Number of Visits  25    Date for PT Re-Evaluation  07/05/18    Authorization Type  [employer] Medicare    PT Start Time  1315    PT Stop Time  1400    PT Time Calculation (min)  45 min    Equipment Utilized During Treatment  Gait belt    Activity Tolerance  Patient tolerated treatment well;Patient limited by pain    Behavior During Therapy  [facility] for tasks assessed/performed       Past Medical History:  Diagnosis Date  . Diabetic peripheral neuropathy (HCC)   . Gangrene of left foot (HCC)   . High cholesterol   . Hypertension   . OSA on CPAP   . Type II diabetes mellitus (HCC)     Past Surgical History:  Procedure Laterality Date  . AMPUTATION Left 12/25/2017   Procedure: LEFT BELOW KNEE AMPUTATION;  Surgeon: Nadara Mustarduda, Marcus V, MD;  [facility];  Service: Orthopedics;  Laterality: Left;  . HEEL SPUR EXCISION Bilateral 1992  . ROTATOR CUFF REPAIR Left 1990s    There were no vitals filed for this visit.  Subjective Assessment - 04/21/18 1315    Subjective  He has been wearing prosthesis 4 hrs 2x/day. Still not able to get orange liner on limb. No falls.     Pertinent History  LTTA, DM2, HTN, Hyperbilirubinemia, Hypoatremia,      Limitations  Standing;Lifting;Walking;House hold activities    Patient Stated Goals  Use prosthesis to walk in community, be active including fishing & hunting    Currently in Pain?  No/denies    Pain Onset  1 to 4 weeks ago                       [facility]/Exercise - 04/21/18 1315      Ambulation/Gait   Ambulation/Gait  Yes     Ambulation/Gait Assistance  5: Supervision    Ambulation/Gait Assistance Details  cues on step width, upright posture and wt shift over prothesis in stance    Ambulation Distance (Feet)  400 Feet    Assistive device  Prosthesis;None    Gait Pattern  Step-through pattern;Decreased arm swing - left;Decreased stance time - left;Decreased step length - right;Decreased stride length;Decreased hip/knee flexion - left;Decreased weight shift to left;Left hip hike;Left flexed knee in stance;Trendelenburg;Antalgic;Lateral hip instability;Trunk flexed;Abducted - left    Ambulation Surface  Indoor;Level    Stairs  Yes    Stairs Assistance  4: Min guard;5: Supervision    Stairs Assistance Details (indicate cue type and reason)  demo & verbal cues on alternating pattern with TTA prosthesis    Stair Management Technique  Two rails;Alternating pattern;Forwards;One rail Left   unable to descend with single rail   Number of Stairs  4   5 reps   Ramp  5: Supervision   prosthesis only   Ramp Details (indicate cue type and reason)  demo, tactile & verbal cues on technique with TTA prosthesis    Curb  4: Min assist    Curb Details (indicate cue type  and reason)  demo, tactile & verbal cues on technique with TTA prosthesis      High Level Balance   High Level Balance Activities  Side stepping;Backward walking;Direction changes;Figure 8 turns;Negotitating around obstacles;Negotiating over obstacles   prosthesis only   High Level Balance Comments  demo, tactile & verbal cues on technique with TTA prosthesis      Therapeutic Activites    Therapeutic Activities  Lifting    Lifting  PT demo, instructed in lifting light wt objects & 15# box from floor. Pt return demo understanding with verbal cues.       Neuro Re-ed    Neuro Re-ed Details   core stabilization standing exercises with red theraband: reciprocal & BUEs 10 reps ea rows, punches & overhead lift.       Exercises   Exercises  Knee/Hip      Prosthetics    Prosthetic Care Comments   Use & application of Tegaderm.  Wear with prosthesis and remove in evening with prosthesis and apply new one in morning prior to donning.  PT debrided loose scab from medial wound (Dr. Lajoyce Corners office "dug" & removed staple still present).  No signs of infection.     Current prosthetic wear tolerance (days/week)   daily    Current prosthetic wear tolerance (#hours/day)   4 hrs 2x/day, PT recommended increasing to 5 hrs 2x/day.     Edema  pitting edema    Residual limb condition   4 scabs on incision with no drainage, no signs of infection, dry skin, redness distally, cylinderical shape,     Education Provided  Skin check;Residual limb care;Correct ply sock adjustment;Proper wear schedule/adjustment;Other (comment)   see prosthetic care   Person(s) Educated  Patient    Education Method  Explanation;Demonstration;Tactile cues;Verbal cues    Education Method  Verbalized understanding;Returned demonstration;Tactile cues required;Verbal cues required;Needs further instruction               PT Short Term Goals - 04/15/18 1243      PT SHORT TERM GOAL #1   Title  Patient tolerates prostheis wear >8hrs total / day without increase in skin issues. (All STGs Target Date: 05/07/2018)    Time  1    Period  Months    Status  On-going    Target Date  05/07/18      PT SHORT TERM GOAL #2   Title  Patient demonstrates proper donning and verbalizes proper cleaning of prosthesis.     Time  1    Period  Months    Status  On-going    Target Date  05/07/18      PT SHORT TERM GOAL #3   Title  Patient able to reach 5", pick up objects from floor and looks over shoulders without UE support with no loss of balance.     Time  1    Period  Months    Status  On-going    Target Date  05/07/18      PT SHORT TERM GOAL #4   Title  Patient ambulates 300' with cane & prosthesis with supervision.     Time  1    Period  Months    Status  On-going    Target Date  05/07/18       PT SHORT TERM GOAL #5   Title  Patient negotiates ramps, curbs and stairs with one rail with cane with supervision.     Time  1    Period  Months    Status  On-going    Target Date  05/07/18        PT Long Term Goals - 04/15/18 1244      PT LONG TERM GOAL #1   Title  Patient tolerates wear of prosthesis >90% of awake hours to without skin issues and limb pain <5/10 to enable function throughout his day. (All LTGs Target Date: 07/02/2018)    Time  3    Period  Months    Status  On-going    Target Date  07/02/18      PT LONG TERM GOAL #2   Title  Patient verbalizes & demonstrates proper prosthetic care to enable safe use of prosthesis.     Time  3    Period  Months    Status  On-going    Target Date  07/02/18      PT LONG TERM GOAL #3   Title  Berg Balance >45/56 to indicate lower fall risk.     Time  3    Period  Months    Status  On-going    Target Date  07/02/18      PT LONG TERM GOAL #4   Title  Patient ambulates 1000' outdoors including grass, ramps, curbs & stairs with prosthesis only modified independent to enable community access.     Time  3    Period  Months    Status  On-going    Target Date  07/02/18      PT LONG TERM GOAL #5   Title  Functional Gait Assessment >/= 19/30 to indicate lower fall risk.     Time  3    Period  Months    Status  On-going    Target Date  07/02/18      PT LONG TERM GOAL #6   Title  Patient demonstrates proper technique to lift & carry 25# box, push, pull and climb ladders to enable return to prior activities which is his goal.     Time  3    Period  Months    Status  On-going    Target Date  07/02/18            Plan - 04/21/18 1500    Clinical Impression Statement  Patient's medial wound scab was loosened to enable removal of dead scab tissue with healthy blood (not bleeding) field beneath and he appears to understand use of Tegaderm to protect wound with wear of prosthesis. Patient's limb is still too edematous for  intermittent Pelite foam liner.     Rehab Potential  Good    PT Frequency  2x / week    PT Duration  Other (comment)   13 weeks (90 days)   PT Treatment/Interventions  ADLs/Self Care Home Management;Canalith Repostioning;DME Instruction;Gait training;Stair training;Functional mobility training;Therapeutic activities;Therapeutic exercise;Balance training;Neuromuscular re-education;Patient/family education;Prosthetic Training;Manual techniques;Scar mobilization;Vestibular    PT Next Visit Plan  check wound & continue use of Tegaderm, review ply sock adjustment and prosthetic care. work towards [employer].  balance, gait, and cardio training.    Consulted and Agree with Plan of Care  Patient       Patient will benefit from skilled therapeutic intervention in order to improve the following deficits and impairments:  Abnormal gait, Decreased balance, Decreased mobility, Decreased skin integrity, Decreased strength, Dizziness, Impaired flexibility, Postural dysfunction, Prosthetic Dependency, Pain  Visit Diagnosis: Abnormal posture  Unsteadiness on feet  Other abnormalities of gait and mobility  Muscle weakness (generalized)  Pain  in left leg     Problem List Patient Active Problem List   Diagnosis Date Noted  . Below-knee amputation of left lower extremity (HCC)   . Postoperative pain   . Type II diabetes mellitus (HCC) 12/22/2017  . Essential hypertension 12/22/2017  . Renal insufficiency, mild 12/22/2017  . Hyponatremia 12/22/2017  . Hyperbilirubinemia 12/22/2017  . Severe protein-calorie malnutrition (HCC)   . Diabetic polyneuropathy associated with type 2 diabetes mellitus (HCC)     Nikash Mortensen PT, DPT 04/22/2018, 11:52 AM  [facility] [address] [place], [place], [address] Phone: [phone]   Fax:  [phone]  Name: Griffyn Kucinski MRN: [id number] Date of Birth: 12/23/1950

## 2018-04-27 ENCOUNTER — Ambulatory Visit: Payer: Medicare Other | Attending: Orthopedic Surgery

## 2018-04-27 DIAGNOSIS — R2689 Other abnormalities of gait and mobility: Secondary | ICD-10-CM | POA: Diagnosis present

## 2018-04-27 DIAGNOSIS — R2681 Unsteadiness on feet: Secondary | ICD-10-CM | POA: Diagnosis present

## 2018-04-27 DIAGNOSIS — M79605 Pain in left leg: Secondary | ICD-10-CM | POA: Diagnosis present

## 2018-04-27 DIAGNOSIS — R293 Abnormal posture: Secondary | ICD-10-CM | POA: Insufficient documentation

## 2018-04-27 DIAGNOSIS — M6281 Muscle weakness (generalized): Secondary | ICD-10-CM | POA: Diagnosis present

## 2018-04-27 NOTE — Therapy (Signed)
[facility] [address] [place], [place], [address] Phone: [phone]   Fax:  [phone]  Physical Therapy Treatment  Patient Details  Name: Donald [name] MRN: [id number] Date of Birth: 10/07/1950 Referring Provider (PT): Aldean BakerMarcus Duda, MD   Encounter Date: 04/27/2018  PT End of Session - 04/27/18 1327    Visit Number  6    Number of Visits  25    Date for PT Re-Evaluation  07/05/18    Authorization Type  [employer]    PT Start Time  1315    PT Stop Time  1400    PT Time Calculation (min)  45 min    Equipment Utilized During Treatment  Gait belt    Activity Tolerance  Patient tolerated treatment well;Patient limited by pain    Behavior During Therapy  [facility] for tasks assessed/performed       Past Medical History:  Diagnosis Date  . Diabetic peripheral neuropathy (HCC)   . Gangrene of left foot (HCC)   . High cholesterol   . Hypertension   . OSA on CPAP   . Type II diabetes mellitus (HCC)     Past Surgical History:  Procedure Laterality Date  . AMPUTATION Left 12/25/2017   Procedure: LEFT BELOW KNEE AMPUTATION;  Surgeon: Nadara Mustarduda, Marcus V, MD;  [facility];  Service: Orthopedics;  Laterality: Left;  . HEEL SPUR EXCISION Bilateral 1992  . ROTATOR CUFF REPAIR Left 1990s    There were no vitals filed for this visit.  Subjective Assessment - 04/27/18 1320    Subjective  Pt reports wearing prosthesis 4hrs 2x/day with no skin issues, no falls to reports. Pt still unable to donn orange liner due to swelling.     Pertinent History  LTTA, DM2, HTN, Hyperbilirubinemia, Hypoatremia,      Limitations  Standing;Lifting;Walking;House hold activities    Patient Stated Goals  Use prosthesis to walk in community, be active including fishing & hunting    Currently in Pain?  Yes    Pain Score  10-Worst pain ever    Pain Location  Leg    Pain Orientation  Left;Anterior    Pain Descriptors / Indicators   Aching;Throbbing    Pain Type  Acute pain    Pain Onset  More than a month ago    Pain Frequency  Constant    Pain Relieving Factors  Taking off prosthesis         [facility] Adult PT Treatment/Exercise - 04/27/18 1400      Ambulation/Gait   Ambulation/Gait  Yes    Ambulation/Gait Assistance  5: Supervision    Ambulation/Gait Assistance Details  VC's for weight shift and posture.     Ambulation Distance (Feet)  230 Feet    Assistive device  Prosthesis;None    Gait Pattern  Step-through pattern;Decreased arm swing - left;Decreased stance time - left;Decreased step length - right;Decreased stride length;Decreased hip/knee flexion - left;Decreased weight shift to left;Left hip hike;Left flexed knee in stance;Trendelenburg;Antalgic;Lateral hip instability;Trunk flexed;Abducted - left    Ambulation Surface  Level;Indoor      High Level Balance   High Level Balance Activities  Side stepping;Negotiating over obstacles    High Level Balance Comments  In parallel bars negotiating over tall orange hurdles fwds/side step with step to pattern, BUE progressing to SUE support required.       Neuro Re-ed    Neuro Re-ed Details   Pt standing on blue foam beam in parallel bars picking  up/replacing objects from floor/L to R side, with SUE support progressing to no UE support, minimal instability.      Pt standing in parallel bars on blue foam beam performing toe/center/heel taps with SUE support progressing to intermittent SUE support with Max VC's for sequence and technique, forward gaze. min/mod instability/postural sway.       Prosthetics   Prosthetic Care Comments   Therapist reccomended increase to 5hrs/2xday with proper skin check between wear.     Current prosthetic wear tolerance (days/week)   daily    Current prosthetic wear tolerance (#hours/day)   4 hrs 2x/day    Education Provided  Skin check;Proper wear schedule/adjustment    Person(s) Educated  Patient    Education Method  Explanation;Verbal  cues    Education Method  Verbalized understanding;Verbal cues required;Needs further instruction        PT Short Term Goals - 04/15/18 1243      PT SHORT TERM GOAL #1   Title  Patient tolerates prostheis wear >8hrs total / day without increase in skin issues. (All STGs Target Date: 05/07/2018)    Time  1    Period  Months    Status  On-going    Target Date  05/07/18      PT SHORT TERM GOAL #2   Title  Patient demonstrates proper donning and verbalizes proper cleaning of prosthesis.     Time  1    Period  Months    Status  On-going    Target Date  05/07/18      PT SHORT TERM GOAL #3   Title  Patient able to reach 5", pick up objects from floor and looks over shoulders without UE support with no loss of balance.     Time  1    Period  Months    Status  On-going    Target Date  05/07/18      PT SHORT TERM GOAL #4   Title  Patient ambulates 300' with cane & prosthesis with supervision.     Time  1    Period  Months    Status  On-going    Target Date  05/07/18      PT SHORT TERM GOAL #5   Title  Patient negotiates ramps, curbs and stairs with one rail with cane with supervision.     Time  1    Period  Months    Status  On-going    Target Date  05/07/18        PT Long Term Goals - 04/15/18 1244      PT LONG TERM GOAL #1   Title  Patient tolerates wear of prosthesis >90% of awake hours to without skin issues and limb pain <5/10 to enable function throughout his day. (All LTGs Target Date: 07/02/2018)    Time  3    Period  Months    Status  On-going    Target Date  07/02/18      PT LONG TERM GOAL #2   Title  Patient verbalizes & demonstrates proper prosthetic care to enable safe use of prosthesis.     Time  3    Period  Months    Status  On-going    Target Date  07/02/18      PT LONG TERM GOAL #3   Title  Berg Balance >45/56 to indicate lower fall risk.     Time  3    Period  Months    Status  On-going    Target Date  07/02/18      PT LONG TERM GOAL #4    Title  Patient ambulates 1000' outdoors including grass, ramps, curbs & stairs with prosthesis only modified independent to enable community access.     Time  3    Period  Months    Status  On-going    Target Date  07/02/18      PT LONG TERM GOAL #5   Title  Functional Gait Assessment >/= 19/30 to indicate lower fall risk.     Time  3    Period  Months    Status  On-going    Target Date  07/02/18      PT LONG TERM GOAL #6   Title  Patient demonstrates proper technique to lift & carry 25# box, push, pull and climb ladders to enable return to prior activities which is his goal.     Time  3    Period  Months    Status  On-going    Target Date  07/02/18            Plan - 04/27/18 1441    Clinical Impression Statement  Todays skilled session focused on prosthetic gait with no AD, high level balance in parallel bars on compliant/non compliant surfaces and prosthetic care. Pt is making progress and should benefit from continued PT sessions to progress towards goals.     Rehab Potential  Good    PT Frequency  2x / week    PT Duration  Other (comment)   13 weeks (90 days)   PT Treatment/Interventions  ADLs/Self Care Home Management;Canalith Repostioning;DME Instruction;Gait training;Stair training;Functional mobility training;Therapeutic activities;Therapeutic exercise;Balance training;Neuromuscular re-education;Patient/family education;Prosthetic Training;Manual techniques;Scar mobilization;Vestibular    PT Next Visit Plan  check wound & continue use of Tegaderm, review ply sock adjustment and prosthetic care. work towards [employer].  balance, gait, and cardio training.    Consulted and Agree with Plan of Care  Patient       Patient will benefit from skilled therapeutic intervention in order to improve the following deficits and impairments:  Abnormal gait, Decreased balance, Decreased mobility, Decreased skin integrity, Decreased strength, Dizziness, Impaired flexibility, Postural  dysfunction, Prosthetic Dependency, Pain  Visit Diagnosis: Abnormal posture  Pain in left leg  Muscle weakness (generalized)  Unsteadiness on feet  Other abnormalities of gait and mobility     Problem List Patient Active Problem List   Diagnosis Date Noted  . Below-knee amputation of left lower extremity (HCC)   . Postoperative pain   . Type II diabetes mellitus (HCC) 12/22/2017  . Essential hypertension 12/22/2017  . Renal insufficiency, mild 12/22/2017  . Hyponatremia 12/22/2017  . Hyperbilirubinemia 12/22/2017  . Severe protein-calorie malnutrition (HCC)   . Diabetic polyneuropathy associated with type 2 diabetes mellitus (HCC)    Donald [name], PTA  Donald [name] 04/27/2018, 2:43 PM  [facility] [address] [place], [place], [address] Phone: [phone]   Fax:  [phone]  Name: Donald [name] MRN: [id number] Date of Birth: [birth year]

## 2018-04-28 ENCOUNTER — Ambulatory Visit: Payer: Medicare Other | Admitting: Physical Therapy

## 2018-04-28 ENCOUNTER — Encounter: Payer: Self-pay | Admitting: Physical Therapy

## 2018-04-28 DIAGNOSIS — R2681 Unsteadiness on feet: Secondary | ICD-10-CM

## 2018-04-28 DIAGNOSIS — R293 Abnormal posture: Secondary | ICD-10-CM | POA: Diagnosis not present

## 2018-04-28 DIAGNOSIS — M79605 Pain in left leg: Secondary | ICD-10-CM

## 2018-04-28 DIAGNOSIS — R2689 Other abnormalities of gait and mobility: Secondary | ICD-10-CM

## 2018-04-28 DIAGNOSIS — M6281 Muscle weakness (generalized): Secondary | ICD-10-CM

## 2018-04-29 NOTE — Therapy (Signed)
[facility] [address] [place], [place], [address] Phone: [phone]   Fax:  [phone]  Physical Therapy Treatment  Patient Details  Name: Donald [name] MRN: [id number] Date of Birth: 1950/08/27 Referring Provider (PT): Aldean Baker, MD   Encounter Date: 04/28/2018  PT End of Session - 04/28/18 1800    Visit Number  7    Number of Visits  25    Date for PT Re-Evaluation  07/05/18    Authorization Type  [employer] Medicare    PT Start Time  1315    PT Stop Time  1400    PT Time Calculation (min)  45 min    Equipment Utilized During Treatment  Gait belt    Activity Tolerance  Patient tolerated treatment well;Patient limited by pain    Behavior During Therapy  [facility] for tasks assessed/performed       Past Medical History:  Diagnosis Date  . Diabetic peripheral neuropathy (HCC)   . Gangrene of left foot (HCC)   . High cholesterol   . Hypertension   . OSA on CPAP   . Type II diabetes mellitus (HCC)     Past Surgical History:  Procedure Laterality Date  . AMPUTATION Left 12/25/2017   Procedure: LEFT BELOW KNEE AMPUTATION;  Surgeon: Nadara Mustard, MD;  Location: [facility];  Service: Orthopedics;  Laterality: Left;  . HEEL SPUR EXCISION Bilateral 1992  . ROTATOR CUFF REPAIR Left 1990s    There were no vitals filed for this visit.  Subjective Assessment - 04/28/18 1315    Subjective  He has been wearing prosthesis 4-5 hrs 2x/day for ~week with no issues. Still not able to wear secondary pelite foam liner.   (Pended)     Pertinent History  LTTA, DM2, HTN, Hyperbilirubinemia, Hypoatremia,    (Pended)     Limitations  Standing;Lifting;Walking;House hold activities  (Pended)     Patient Stated Goals  Use prosthesis to walk in community, be active including fishing & hunting  (Pended)     Currently in Pain?  Yes  (Pended)     Pain Score  10-Worst pain ever  (Pended)     Pain Location  Leg  (Pended)     Pain Onset  More  than a month ago  (Pended)                        [facility] Adult PT Treatment/Exercise - 04/28/18 1315      Transfers   Transfers  Floor to Transfer    Floor to Transfer  5: Supervision    Floor to Transfer Details (indicate cue type and reason)  PT demo, instructed in floor transfers using UEs to push on horizontal surface / chair seat leading with either LE. Pt return demo both directions with verbal cues.       Ambulation/Gait   Ambulation/Gait  Yes    Ambulation/Gait Assistance  5: Supervision    Ambulation/Gait Assistance Details  cues on step width using line on floor for visual reference to decrease abduction, progressed to proprioception.     Ambulation Distance (Feet)  500 Feet    Assistive device  Prosthesis;None    Gait Pattern  Step-through pattern;Decreased arm swing - left;Decreased stance time - left;Decreased step length - right;Decreased stride length;Decreased hip/knee flexion - left;Decreased weight shift to left;Left hip hike;Left flexed knee in stance;Trendelenburg;Antalgic;Lateral hip instability;Trunk flexed;Abducted - left    Ambulation Surface  Indoor;Level;Outdoor;Gravel;Grass;Other (comment)  grass slope   Stairs  Yes    Stairs Assistance  5: Supervision    Stairs Assistance Details (indicate cue type and reason)  demo & verbal cues on alternate pattern    Stair Management Technique  Two rails;One rail Right;One rail Left;Alternating pattern;Forwards    Number of Stairs  4   5 reps   Ramp  5: Supervision   prosthesis only   Ramp Details (indicate cue type and reason)  cues on proper wt shift    Curb  5: Supervision   prosthesis only     High Level Balance   High Level Balance Activities  Side stepping;Negotiating over obstacles;Backward walking;Figure 8 turns;Negotitating around obstacles   prosthesis only   High Level Balance Comments  verbal & tactile cues on technique with supervision      Therapeutic Activites    Therapeutic Activities   Lifting;Work Psychologist, prison and probation services  PT reviewed lifting a 15# box. Pt return demo with verbal cues.     Work Nurse, children's, instructed in climbing an A-frame ladder with TTA prosthesis. Pt return demo understanding with minA.       Neuro Re-ed    Neuro Re-ed Details   --      Prosthetics   Prosthetic Care Comments   increase wear to 6 hrs 2x/day drying limb /liner half way. PT reviewed use of Tegaderm when wearing prosthesis.     Current prosthetic wear tolerance (days/week)   daily    Current prosthetic wear tolerance (#hours/day)   5 hrs 2x/day, recommendation to increase to 6 hrs 2x/day drying half way    Education Provided  Skin check;Proper wear schedule/adjustment;Proper Donning;Other (comment)   see prosthetic care comments   Person(s) Educated  Patient    Education Method  Explanation;Demonstration;Tactile cues;Verbal cues    Education Method  Verbalized understanding;Returned demonstration;Tactile cues required;Verbal cues required;Needs further instruction          Balance Exercises - 04/28/18 1315      Balance Exercises: Standing   Standing Eyes Opened  Wide (BOA);Foam/compliant surface;Head turns;5 reps   4 directions   Standing Eyes Closed  Wide (BOA);Head turns;Solid surface;5 reps   4 directions         PT Short Term Goals - 04/15/18 1243      PT SHORT TERM GOAL #1   Title  Patient tolerates prostheis wear >8hrs total / day without increase in skin issues. (All STGs Target Date: 05/07/2018)    Time  1    Period  Months    Status  On-going    Target Date  05/07/18      PT SHORT TERM GOAL #2   Title  Patient demonstrates proper donning and verbalizes proper cleaning of prosthesis.     Time  1    Period  Months    Status  On-going    Target Date  05/07/18      PT SHORT TERM GOAL #3   Title  Patient able to reach 5", pick up objects from floor and looks over shoulders without UE support with no loss of balance.     Time  1    Period  Months     Status  On-going    Target Date  05/07/18      PT SHORT TERM GOAL #4   Title  Patient ambulates 300' with cane & prosthesis with supervision.     Time  1    Period  Months  Status  On-going    Target Date  05/07/18      PT SHORT TERM GOAL #5   Title  Patient negotiates ramps, curbs and stairs with one rail with cane with supervision.     Time  1    Period  Months    Status  On-going    Target Date  05/07/18        PT Long Term Goals - 04/15/18 1244      PT LONG TERM GOAL #1   Title  Patient tolerates wear of prosthesis >90% of awake hours to without skin issues and limb pain <5/10 to enable function throughout his day. (All LTGs Target Date: 07/02/2018)    Time  3    Period  Months    Status  On-going    Target Date  07/02/18      PT LONG TERM GOAL #2   Title  Patient verbalizes & demonstrates proper prosthetic care to enable safe use of prosthesis.     Time  3    Period  Months    Status  On-going    Target Date  07/02/18      PT LONG TERM GOAL #3   Title  Berg Balance >45/56 to indicate lower fall risk.     Time  3    Period  Months    Status  On-going    Target Date  07/02/18      PT LONG TERM GOAL #4   Title  Patient ambulates 1000' outdoors including grass, ramps, curbs & stairs with prosthesis only modified independent to enable community access.     Time  3    Period  Months    Status  On-going    Target Date  07/02/18      PT LONG TERM GOAL #5   Title  Functional Gait Assessment >/= 19/30 to indicate lower fall risk.     Time  3    Period  Months    Status  On-going    Target Date  07/02/18      PT LONG TERM GOAL #6   Title  Patient demonstrates proper technique to lift & carry 25# box, push, pull and climb ladders to enable return to prior activities which is his goal.     Time  3    Period  Months    Status  On-going    Target Date  07/02/18            Plan - 04/28/18 1807    Clinical Impression Statement  Patient's wound appears to be  healing with no signs of infection. He has general understanding of floor transfers &  climbing A-frame ladder introduced today.     Rehab Potential  Good    PT Frequency  2x / week    PT Duration  Other (comment)   13 weeks (90 days)   PT Treatment/Interventions  ADLs/Self Care Home Management;Canalith Repostioning;DME Instruction;Gait training;Stair training;Functional mobility training;Therapeutic activities;Therapeutic exercise;Balance training;Neuromuscular re-education;Patient/family education;Prosthetic Training;Manual techniques;Scar mobilization;Vestibular    PT Next Visit Plan  check STGs. progress balance activities on compliant surfaces & with eyes closed.     Consulted and Agree with Plan of Care  Patient       Patient will benefit from skilled therapeutic intervention in order to improve the following deficits and impairments:  Abnormal gait, Decreased balance, Decreased mobility, Decreased skin integrity, Decreased strength, Dizziness, Impaired flexibility, Postural dysfunction, Prosthetic Dependency, Pain  Visit Diagnosis: Abnormal  posture  Pain in left leg  Muscle weakness (generalized)  Unsteadiness on feet  Other abnormalities of gait and mobility     Problem List Patient Active Problem List   Diagnosis Date Noted  . Below-knee amputation of left lower extremity (HCC)   . Postoperative pain   . Type II diabetes mellitus (HCC) 12/22/2017  . Essential hypertension 12/22/2017  . Renal insufficiency, mild 12/22/2017  . Hyponatremia 12/22/2017  . Hyperbilirubinemia 12/22/2017  . Severe protein-calorie malnutrition (HCC)   . Diabetic polyneuropathy associated with type 2 diabetes mellitus (HCC)     Mellissa Conley PT, DPT 04/29/2018, 6:11 PM  [facility] [address] [place], [place], [address] Phone: [phone]   Fax:  [phone]  Name: Donald [name] MRN: [id number] Date of Birth:  10/01/1950

## 2018-05-03 ENCOUNTER — Encounter: Payer: Self-pay | Admitting: Physical Therapy

## 2018-05-03 ENCOUNTER — Ambulatory Visit: Payer: Medicare Other | Admitting: Physical Therapy

## 2018-05-03 DIAGNOSIS — R2689 Other abnormalities of gait and mobility: Secondary | ICD-10-CM

## 2018-05-03 DIAGNOSIS — R2681 Unsteadiness on feet: Secondary | ICD-10-CM

## 2018-05-03 DIAGNOSIS — M6281 Muscle weakness (generalized): Secondary | ICD-10-CM

## 2018-05-03 DIAGNOSIS — M79605 Pain in left leg: Secondary | ICD-10-CM

## 2018-05-03 DIAGNOSIS — R293 Abnormal posture: Secondary | ICD-10-CM | POA: Diagnosis not present

## 2018-05-03 NOTE — Therapy (Signed)
[facility] [address] [place], [place], [address] Phone: [phone]   Fax:  [phone]  Physical Therapy Treatment  Patient Details  Name: Donald [name] MRN: [id number] Date of Birth: Oct 19, 1950 Referring Provider (PT): Meridee Score, MD   Encounter Date: 05/03/2018  PT End of Session - 05/03/18 0943    Visit Number  8    Number of Visits  25    Date for PT Re-Evaluation  07/05/18    Authorization Type  [employer] Medicare    PT Start Time  [phone]    PT Stop Time  0918    PT Time Calculation (min)  31 min    Equipment Utilized During Treatment  Gait belt    Activity Tolerance  Patient tolerated treatment well;Patient limited by pain    Behavior During Therapy  [facility] for tasks assessed/performed       Past Medical History:  Diagnosis Date  . Diabetic peripheral neuropathy ([place])   . Gangrene of left foot ([place])   . High cholesterol   . Hypertension   . OSA on CPAP   . Type II diabetes mellitus ([place])     Past Surgical History:  Procedure Laterality Date  . AMPUTATION Left 12/25/2017   Procedure: LEFT BELOW KNEE AMPUTATION;  Surgeon: Newt Minion, MD;  Location: [facility];  Service: Orthopedics;  Laterality: Left;  . HEEL SPUR EXCISION Bilateral 1992  . ROTATOR CUFF REPAIR Left 1990s    There were no vitals filed for this visit.  Subjective Assessment - 05/03/18 0846    Subjective  He is wearing prosthesis from arising to bedtime.     Pertinent History  LTTA, DM2, HTN, Hyperbilirubinemia, Hypoatremia,      Limitations  Standing;Lifting;Walking;House hold activities    Patient Stated Goals  Use prosthesis to walk in community, be active including fishing & hunting    Currently in Pain?  No/denies    Pain Onset  More than a month ago         [facility] PT Assessment - 05/03/18 0850      Berg Balance Test   Sit to Stand  Able to stand without using hands and stabilize independently    Standing Unsupported  Able to  stand safely 2 minutes    Sitting with Back Unsupported but Feet Supported on Floor or Stool  Able to sit safely and securely 2 minutes    Stand to Sit  Sits safely with minimal use of hands    Transfers  Able to transfer safely, minor use of hands    Standing Unsupported with Eyes Closed  Able to stand 10 seconds safely    Standing Ubsupported with Feet Together  Able to place feet together independently and stand 1 minute safely    From Standing, Reach Forward with Outstretched Arm  Can reach confidently >25 cm (10")    From Standing Position, Pick up Object from Floor  Able to pick up shoe safely and easily    From Standing Position, Turn to Look Behind Over each Shoulder  Looks behind from both sides and weight shifts well    Turn 360 Degrees  Able to turn 360 degrees safely in 4 seconds or less    Standing Unsupported, Alternately Place Feet on Step/Stool  Able to stand independently and safely and complete 8 steps in 20 seconds    Standing Unsupported, One Foot in Front  Able to take small step independently and hold 30 seconds  Standing on One Leg  Able to lift leg independently and hold 5-10 seconds    Total Score  53    Berg comment:  Initial Berg 31/56      Functional Gait  Assessment   Gait assessed   Yes    Gait Level Surface  Walks 20 ft in less than 7 sec but greater than 5.5 sec, uses assistive device, slower speed, mild gait deviations, or deviates 6-10 in outside of the 12 in walkway width.    Change in Gait Speed  Able to smoothly change walking speed without loss of balance or gait deviation. Deviate no more than 6 in outside of the 12 in walkway width.    Gait with Horizontal Head Turns  Performs head turns smoothly with no change in gait. Deviates no more than 6 in outside 12 in walkway width    Gait with Vertical Head Turns  Performs head turns with no change in gait. Deviates no more than 6 in outside 12 in walkway width.    Gait and Pivot Turn  Pivot turns safely  within 3 sec and stops quickly with no loss of balance.    Step Over Obstacle  Is able to step over one shoe box (4.5 in total height) without changing gait speed. No evidence of imbalance.    Gait with Narrow Base of Support  Ambulates 7-9 steps.    Gait with Eyes Closed  Walks 20 ft, uses assistive device, slower speed, mild gait deviations, deviates 6-10 in outside 12 in walkway width. Ambulates 20 ft in less than 9 sec but greater than 7 sec.    Ambulating Backwards  Walks 20 ft, uses assistive device, slower speed, mild gait deviations, deviates 6-10 in outside 12 in walkway width.    Steps  Alternating feet, must use rail.    Total Score  24    FGA comment:  Initial FGA 9/30                   [facility] Adult PT Treatment/Exercise - 05/03/18 0850      Transfers   Sit to Stand  7: Independent;Without upper extremity assist;From chair/3-in-1    Floor to Transfer  7: Independent;Without upper extremity assist;From chair/3-in-1      Ambulation/Gait   Ambulation/Gait  Yes    Ambulation/Gait Assistance  5: Supervision    Ambulation/Gait Assistance Details  cues on step width    Ambulation Distance (Feet)  500 Feet    Assistive device  Prosthesis;None    Ambulation Surface  Indoor;Level    Gait velocity  3.41 ft/sec comfortable & 5.14 ft/sec fast pace   Initial GV 1.12 ft/sec   Stairs  Yes    Stairs Assistance  5: Supervision    Stair Management Technique  Two rails;One rail Left;Alternating pattern;Forwards    Number of Stairs  4   2 reps   Ramp  5: Supervision   prosthesis only   Curb  5: Supervision   prosthesis only     Prosthetics   Prosthetic Care Comments   PT used tweezers to remove part of suture from medial wound which continues to heal with smaller size. PT also removed dry scabs from 2 other areas on residual limb with healed tissue beneath. PT explained ongoing prosthetist follow-up after PT discharges.     Current prosthetic wear tolerance (days/week)   daily     Current prosthetic wear tolerance (#hours/day)   most of awake hours  Residual limb condition   42m medial wound with no signs of infection. Dry skin. normal color & temperature.     Education Provided  Other (comment)   see prosthetic care comments   Person(s) Educated  Patient    Education Method  Explanation;Verbal cues    Education Method  Verbalized understanding;Verbal cues required               PT Short Term Goals - 05/03/18 0944      PT SHORT TERM GOAL #1   Title  Patient tolerates prostheis wear >8hrs total / day without increase in skin issues. (All STGs Target Date: 05/07/2018)    Baseline  MET 05/03/2018    Time  1    Period  Months    Status  Achieved      PT SHORT TERM GOAL #2   Title  Patient demonstrates proper donning and verbalizes proper cleaning of prosthesis.     Baseline  MET 05/03/2018    Time  1    Period  Months    Status  Achieved      PT SHORT TERM GOAL #3   Title  Patient able to reach 5", pick up objects from floor and looks over shoulders without UE support with no loss of balance.     Baseline  MET 05/03/2018    Time  1    Period  Months    Status  Achieved      PT SHORT TERM GOAL #4   Title  Patient ambulates 300' with cane & prosthesis with supervision.     Baseline  MET 05/03/2018 with prosthesis only    Time  1    Period  Months    Status  Achieved      PT SHORT TERM GOAL #5   Title  Patient negotiates ramps, curbs and stairs with one rail with cane with supervision.     Baseline  MET 05/03/2018 with prosthesis only    Time  1    Period  Months    Status  Achieved        PT Long Term Goals - 05/03/18 0945      PT LONG TERM GOAL #1   Title  Patient tolerates wear of prosthesis >90% of awake hours to without skin issues and limb pain <5/10 to enable function throughout his day. (All LTGs Target Date: 07/02/2018)    Time  3    Period  Months    Status  On-going    Target Date  07/02/18      PT LONG TERM GOAL #2   Title   Patient verbalizes & demonstrates proper prosthetic care to enable safe use of prosthesis.     Time  3    Period  Months    Status  On-going    Target Date  07/02/18      PT LONG TERM GOAL #3   Title  Berg Balance >45/56 to indicate lower fall risk.     Baseline  MET 05/03/2018  Berg Balance 53/56    Time  3    Period  Months    Status  Achieved      PT LONG TERM GOAL #4   Title  Patient ambulates 1000' outdoors including grass, ramps, curbs & stairs with prosthesis only modified independent to enable community access.     Time  3    Period  Months    Status  On-going    Target Date  07/02/18      PT LONG TERM GOAL #5   Title  Functional Gait Assessment >/= 19/30 to indicate lower fall risk.     Baseline  MET 05/03/2018  FGA 23/30    Time  3    Period  Months    Status  Achieved      PT LONG TERM GOAL #6   Title  Patient demonstrates proper technique to lift & carry 25# box, push, pull and climb ladders to enable return to prior activities which is his goal.     Time  3    Period  Months    Status  On-going    Target Date  07/02/18            Plan - 05/03/18 0949    Clinical Impression Statement  Patient's wound continues to heal. He still needs skilled PT care to monitor wound and progress prosthetic function without negative changes to wound. Patient improved Berg Balance & Functional Gait Assessment to low fall risk levels.     Rehab Potential  Good    PT Frequency  2x / week    PT Duration  Other (comment)   13 weeks (90 days)   PT Treatment/Interventions  ADLs/Self Care Home Management;Canalith Repostioning;DME Instruction;Gait training;Stair training;Functional mobility training;Therapeutic activities;Therapeutic exercise;Balance training;Neuromuscular re-education;Patient/family education;Prosthetic Training;Manual techniques;Scar mobilization;Vestibular    PT Next Visit Plan  check wound. If healed, plan to check remaining PT LTGs and discharge.    Consulted  and Agree with Plan of Care  Patient       Patient will benefit from skilled therapeutic intervention in order to improve the following deficits and impairments:  Abnormal gait, Decreased balance, Decreased mobility, Decreased skin integrity, Decreased strength, Dizziness, Impaired flexibility, Postural dysfunction, Prosthetic Dependency, Pain  Visit Diagnosis: Abnormal posture  Pain in left leg  Muscle weakness (generalized)  Unsteadiness on feet  Other abnormalities of gait and mobility     Problem List Patient Active Problem List   Diagnosis Date Noted  . Below-knee amputation of left lower extremity ([place])   . Postoperative pain   . Type II diabetes mellitus ([place]) 12/22/2017  . Essential hypertension 12/22/2017  . Renal insufficiency, mild 12/22/2017  . Hyponatremia 12/22/2017  . Hyperbilirubinemia 12/22/2017  . Severe protein-calorie malnutrition ([place])   . Diabetic polyneuropathy associated with type 2 diabetes mellitus ([place])     Darriona Dehaas PT, DPT 05/03/2018, 9:52 AM  [facility] [address] [place], [place], [address] Phone: [phone]   Fax:  [phone]  Name: Donald [name] MRN: [id number] Date of Birth: 10/28/1950

## 2018-05-06 ENCOUNTER — Encounter: Payer: Medicare HMO | Admitting: Physical Therapy

## 2018-05-10 ENCOUNTER — Ambulatory Visit (INDEPENDENT_AMBULATORY_CARE_PROVIDER_SITE_OTHER): Payer: Medicare Other | Admitting: Orthopedic Surgery

## 2018-05-10 ENCOUNTER — Encounter: Payer: Self-pay | Admitting: Physical Therapy

## 2018-05-10 ENCOUNTER — Encounter (INDEPENDENT_AMBULATORY_CARE_PROVIDER_SITE_OTHER): Payer: Self-pay | Admitting: Orthopedic Surgery

## 2018-05-10 ENCOUNTER — Ambulatory Visit: Payer: Medicare Other | Admitting: Physical Therapy

## 2018-05-10 VITALS — Ht 65.0 in | Wt 153.9 lb

## 2018-05-10 DIAGNOSIS — R293 Abnormal posture: Secondary | ICD-10-CM

## 2018-05-10 DIAGNOSIS — R2681 Unsteadiness on feet: Secondary | ICD-10-CM

## 2018-05-10 DIAGNOSIS — Z89512 Acquired absence of left leg below knee: Secondary | ICD-10-CM

## 2018-05-10 DIAGNOSIS — M6281 Muscle weakness (generalized): Secondary | ICD-10-CM

## 2018-05-10 DIAGNOSIS — R2689 Other abnormalities of gait and mobility: Secondary | ICD-10-CM

## 2018-05-10 DIAGNOSIS — M79605 Pain in left leg: Secondary | ICD-10-CM

## 2018-05-10 DIAGNOSIS — S88112A Complete traumatic amputation at level between knee and ankle, left lower leg, initial encounter: Secondary | ICD-10-CM

## 2018-05-10 NOTE — Therapy (Signed)
[facility] [address] [place], [place], [address] Phone: [phone]   Fax:  [phone]  Physical Therapy Treatment and Discharge Summary  Patient Details  Name: Donald [name] MRN: [id number] Date of Birth: 03/13/1951 Referring Provider (PT): Meridee Score, MD   Encounter Date: 05/10/2018  PHYSICAL THERAPY DISCHARGE SUMMARY  Visits from Start of Care: 9  Current functional level related to goals / functional outcomes: See below   Remaining deficits: See below   Education / Equipment: Prosthetic care  Plan: Patient agrees to discharge.  Patient goals were met. Patient is being discharged due to meeting the stated rehab goals.  ?????          PT End of Session - 05/10/18 1302    Visit Number  9    Number of Visits  25    Date for PT Re-Evaluation  07/05/18    Authorization Type  Aetna Medicare    PT Start Time  0904    PT Stop Time  0930    PT Time Calculation (min)  26 min    Equipment Utilized During Treatment  Gait belt    Activity Tolerance  Patient tolerated treatment well;Patient limited by pain    Behavior During Therapy  WFL for tasks assessed/performed       Past Medical History:  Diagnosis Date  . Diabetic peripheral neuropathy ([place])   . Gangrene of left foot ([place])   . High cholesterol   . Hypertension   . OSA on CPAP   . Type II diabetes mellitus ([place])     Past Surgical History:  Procedure Laterality Date  . AMPUTATION Left 12/25/2017   Procedure: LEFT BELOW KNEE AMPUTATION;  Surgeon: Newt Minion, MD;  Location: [facility];  Service: Orthopedics;  Laterality: Left;  . HEEL SPUR EXCISION Bilateral 1992  . ROTATOR CUFF REPAIR Left 1990s    There were no vitals filed for this visit.  Subjective Assessment - 05/10/18 0912    Subjective  He is still not able to wear secondary pelite liner. Dr. Sharol Given wrote prescription for new socket.  wearing prosthesis all awake hours.     Pertinent History  LTTA, DM2, HTN, Hyperbilirubinemia, Hypoatremia,      Limitations  Standing;Lifting;Walking;House hold activities    Patient Stated Goals  Use prosthesis to walk in community, be active including fishing & hunting    Currently in Pain?  No/denies    Pain Onset  More than a month ago          Prosthetics Assessment - 05/10/18 [place] with  Skin check;Residual limb care;Care of non-amputated limb;Prosthetic cleaning;Ply sock cleaning;Correct ply sock adjustment;Proper wear schedule/adjustment;Proper weight-bearing schedule/adjustment    Prosthetic Care Comments   PT instructed pt in his rights & typical prosthetic socket revisions.     Donning prosthesis   Modified independent (Device/Increase time)    Doffing prosthesis   Modified independent (Device/Increase time)    Current prosthetic wear tolerance (days/week)   daily    Current prosthetic wear tolerance (#hours/day)   >90% of awake hours    Current prosthetic weight-bearing tolerance (hours/day)   >15 minutes     Edema  none    Residual limb condition   no open areas, normal color, temperature, moisture & hair growth    K code/activity level with prosthetic use   K3 full community with variable cadence.  Prosthesis is silicon liner with secondary  pelite liner. He has too much edema that he was unable to donne secondary pelite liner.                   [facility] Adult PT Treatment/Exercise - 05/10/18 0905      Ambulation/Gait   Ambulation/Gait  Yes    Ambulation/Gait Assistance  6: Modified independent (Device/Increase time)    Ambulation Distance (Feet)  1000 Feet    Assistive device  Prosthesis;None    Gait Pattern  Within Functional Limits    Ambulation Surface  Level;Indoor;Outdoor;Paved;Grass    Gait velocity  3.41 ft/sec comfortable & 5.14 ft/sec fast pace    Stairs  Yes    Stairs Assistance  6: Modified independent (Device/Increase time)    Stair  Management Technique  Two rails;One rail Left;Alternating pattern;Forwards    Number of Stairs  4    Ramp  6: Modified independent (Device)   prosthesis only   Curb  6: Modified independent (Device/increase time)   prosthesis only     Therapeutic Activites    Lifting  Pt demo proper lifting & carrying 25# box.                PT Short Term Goals - 05/03/18 0944      PT SHORT TERM GOAL #1   Title  Patient tolerates prostheis wear >8hrs total / day without increase in skin issues. (All STGs Target Date: 05/07/2018)    Baseline  MET 05/03/2018    Time  1    Period  Months    Status  Achieved      PT SHORT TERM GOAL #2   Title  Patient demonstrates proper donning and verbalizes proper cleaning of prosthesis.     Baseline  MET 05/03/2018    Time  1    Period  Months    Status  Achieved      PT SHORT TERM GOAL #3   Title  Patient able to reach 5", pick up objects from floor and looks over shoulders without UE support with no loss of balance.     Baseline  MET 05/03/2018    Time  1    Period  Months    Status  Achieved      PT SHORT TERM GOAL #4   Title  Patient ambulates 300' with cane & prosthesis with supervision.     Baseline  MET 05/03/2018 with prosthesis only    Time  1    Period  Months    Status  Achieved      PT SHORT TERM GOAL #5   Title  Patient negotiates ramps, curbs and stairs with one rail with cane with supervision.     Baseline  MET 05/03/2018 with prosthesis only    Time  1    Period  Months    Status  Achieved        PT Long Term Goals - 05/10/18 1303      PT LONG TERM GOAL #1   Title  Patient tolerates wear of prosthesis >90% of awake hours to without skin issues and limb pain <5/10 to enable function throughout his day. (All LTGs Target Date: 07/02/2018)    Baseline  MET 05/10/2018 wounds were healed this morning and pt wearing prosthesis >90% of awake hours. No limb pain.     Time  3    Period  Months    Status  Achieved      PT LONG TERM  GOAL #2  Title  Patient verbalizes & demonstrates proper prosthetic care to enable safe use of prosthesis.     Baseline  MET 05/10/2018      Time  3    Period  Months    Status  Achieved      PT LONG TERM GOAL #3   Title  Berg Balance >45/56 to indicate lower fall risk.     Baseline  MET 05/03/2018  Berg Balance 53/56    Time  3    Period  Months    Status  Achieved      PT LONG TERM GOAL #4   Title  Patient ambulates 1000' outdoors including grass, ramps, curbs & stairs with prosthesis only modified independent to enable community access.     Baseline  MET 05/03/2018    Time  3    Period  Months    Status  Achieved      PT LONG TERM GOAL #5   Title  Functional Gait Assessment >/= 19/30 to indicate lower fall risk.     Baseline  MET 05/03/2018  FGA 23/30    Time  3    Period  Months    Status  Achieved      PT LONG TERM GOAL #6   Title  Patient demonstrates proper technique to lift & carry 25# box, push, pull and climb ladders to enable return to prior activities which is his goal.     Baseline  MET 05/10/2018    Time  3    Period  Months    Status  Achieved            Plan - 05/10/18 1305    Clinical Impression Statement  Patient met all LTGs. His wounds on residual limb have completely healed and he reports no limb pain. Patient is using prosthesis without assistive device at full community level including some lifting, carrying, pushing, pullind & climbing. Patient is still unable to wear secondary pelite liner and wears 10+ ply socks. PT scheduled consult with prosthetist tomorrow to assess socket fit including taking secondary liner and prescription from Dr. Sharol Given for new socket.      Rehab Potential  Good    PT Frequency  2x / week    PT Duration  Other (comment)   13 weeks (90 days)   PT Treatment/Interventions  ADLs/Self Care Home Management;Canalith Repostioning;DME Instruction;Gait training;Stair training;Functional mobility training;Therapeutic  activities;Therapeutic exercise;Balance training;Neuromuscular re-education;Patient/family education;Prosthetic Training;Manual techniques;Scar mobilization;Vestibular    PT Next Visit Plan  discharge    Consulted and Agree with Plan of Care  Patient       Patient will benefit from skilled therapeutic intervention in order to improve the following deficits and impairments:  Abnormal gait, Decreased balance, Decreased mobility, Decreased skin integrity, Decreased strength, Dizziness, Impaired flexibility, Postural dysfunction, Prosthetic Dependency, Pain  Visit Diagnosis: Abnormal posture  Pain in left leg  Muscle weakness (generalized)  Unsteadiness on feet  Other abnormalities of gait and mobility     Problem List Patient Active Problem List   Diagnosis Date Noted  . Below-knee amputation of left lower extremity ([place])   . Postoperative pain   . Type II diabetes mellitus ([place]) 12/22/2017  . Essential hypertension 12/22/2017  . Renal insufficiency, mild 12/22/2017  . Hyponatremia 12/22/2017  . Hyperbilirubinemia 12/22/2017  . Severe protein-calorie malnutrition ([place])   . Diabetic polyneuropathy associated with type 2 diabetes mellitus ([place])     Keerthi Hazell PT, DPT 05/10/2018, 1:15 PM  [facility]  [facility] [address] [place], [place], [address] Phone: [phone]   Fax:  [phone]  Name: Musa Rewerts MRN: [id number] Date of Birth: 09/07/1950

## 2018-05-10 NOTE — Progress Notes (Signed)
Office Visit Note   Patient: Donald [name]           Date of Birth: 09/19/1950           MRN: [id number] Visit Date: 05/10/2018              Requested by: Claybon JabsBrown, Emily, PA-C [address] [place], [place] [id number] PCP: Claybon JabsBrown, Emily, PA-C  Chief Complaint  Patient presents with  . Left Leg - Follow-up      HPI: Patient is a 67 year old gentleman with a left transtibial amputation.  Patient complains of end bearing pain with weightbearing with his prosthesis.  Patient complains of instability with trying to ambulate with the prosthesis.  Assessment & Plan: Visit Diagnoses:  1. Below-knee amputation of left lower extremity (HCC)     Plan: Patient is given a prescription for biotech for new socket new liner new materials and supplies.  Follow-Up Instructions: Return if symptoms worsen or fail to improve.   Ortho Exam  Patient is alert, oriented, no adenopathy, well-dressed, normal affect, normal respiratory effort. Examination patient has an antalgic gait he does not have rotational stability of his leg he is wearing 20 ply sock he has an bearing on the residual limb and has subsided down into the socket.  There is no distal ulcers no callus no cellulitis no abscess.  Imaging: No results found. No images are attached to the encounter.  Labs: Lab Results  Component Value Date   HGBA1C 5.6 12/22/2017   ESRSEDRATE 129 (H) 12/22/2017   CRP 26.8 (H) 12/22/2017   REPTSTATUS 12/27/2017 FINAL 12/21/2017   CULT  12/21/2017    NO GROWTH 5 DAYS Performed at [facility], [address] [place], [place] [address]      Lab Results  Component Value Date   ALBUMIN 1.7 (L) 12/26/2017   ALBUMIN 1.7 (L) 12/25/2017   ALBUMIN 1.7 (L) 12/24/2017   PREALBUMIN <5 (L) 12/22/2017    Body mass index is 25.61 kg/m.  Orders:  No orders of the defined types were placed in this encounter.  No orders of the defined types were placed in this encounter.    Procedures: No  procedures performed  Clinical Data: No additional findings.  ROS:  All other systems negative, except as noted in the HPI. Review of Systems  Objective: Vital Signs: Ht 5\' 5"  (1.651 m)   Wt 153 lb 14.1 oz (69.8 kg)   BMI 25.61 kg/m   Specialty Comments:  No specialty comments available.  PMFS History: Patient Active Problem List   Diagnosis Date Noted  . Below-knee amputation of left lower extremity ([facility])   . Postoperative pain   . Type II diabetes mellitus ([facility]) 12/22/2017  . Essential hypertension 12/22/2017  . Renal insufficiency, mild 12/22/2017  . Hyponatremia 12/22/2017  . Hyperbilirubinemia 12/22/2017  . Severe protein-calorie malnutrition ([facility])   . Diabetic polyneuropathy associated with type 2 diabetes mellitus ([facility])    Past Medical History:  Diagnosis Date  . Diabetic peripheral neuropathy ([facility])   . Gangrene of left foot ([facility])   . High cholesterol   . Hypertension   . OSA on CPAP   . Type II diabetes mellitus ([facility])     History reviewed. No pertinent family history.  Past Surgical History:  Procedure Laterality Date  . AMPUTATION Left 12/25/2017   Procedure: LEFT BELOW KNEE AMPUTATION;  Surgeon: Nadara Mustarduda, Marcus V, MD;  [facility];  Service: Orthopedics;  Laterality: Left;  . HEEL SPUR  EXCISION Bilateral 1992  . ROTATOR CUFF REPAIR Left 1990s   Social History   Occupational History  . Not on file  Tobacco Use  . Smoking status: Never Smoker  . Smokeless tobacco: Never Used  Substance and Sexual Activity  . Alcohol use: Not Currently    Frequency: Never  . Drug use: Never  . Sexual activity: Not Currently

## 2018-05-13 ENCOUNTER — Encounter: Payer: Medicare HMO | Admitting: Physical Therapy

## 2018-05-17 ENCOUNTER — Encounter: Payer: Medicare HMO | Admitting: Physical Therapy

## 2018-05-24 ENCOUNTER — Encounter: Payer: Medicare HMO | Admitting: Physical Therapy

## 2018-05-27 ENCOUNTER — Encounter: Payer: Medicare HMO | Admitting: Physical Therapy

## 2018-05-31 ENCOUNTER — Encounter: Payer: Medicare HMO | Admitting: Physical Therapy

## 2018-06-03 ENCOUNTER — Encounter: Payer: Medicare HMO | Admitting: Physical Therapy

## 2018-06-07 ENCOUNTER — Encounter: Payer: Medicare HMO | Admitting: Physical Therapy

## 2018-06-10 ENCOUNTER — Encounter: Payer: Medicare HMO | Admitting: Physical Therapy

## 2018-08-30 ENCOUNTER — Telehealth (INDEPENDENT_AMBULATORY_CARE_PROVIDER_SITE_OTHER): Payer: Self-pay

## 2018-08-30 NOTE — Telephone Encounter (Signed)
Called and sw pt answered NO to all COVID-19 prescreen questions. appt tomorrow at 8:30a

## 2018-08-31 ENCOUNTER — Encounter (INDEPENDENT_AMBULATORY_CARE_PROVIDER_SITE_OTHER): Payer: Self-pay | Admitting: Orthopedic Surgery

## 2018-08-31 ENCOUNTER — Ambulatory Visit (INDEPENDENT_AMBULATORY_CARE_PROVIDER_SITE_OTHER): Payer: Medicare Other | Admitting: Physician Assistant

## 2018-08-31 ENCOUNTER — Other Ambulatory Visit: Payer: Self-pay

## 2018-08-31 VITALS — Ht 65.0 in | Wt 153.0 lb

## 2018-08-31 DIAGNOSIS — Z89512 Acquired absence of left leg below knee: Secondary | ICD-10-CM | POA: Diagnosis not present

## 2018-08-31 DIAGNOSIS — E1142 Type 2 diabetes mellitus with diabetic polyneuropathy: Secondary | ICD-10-CM

## 2018-08-31 DIAGNOSIS — S88112A Complete traumatic amputation at level between knee and ankle, left lower leg, initial encounter: Secondary | ICD-10-CM

## 2018-08-31 DIAGNOSIS — N289 Disorder of kidney and ureter, unspecified: Secondary | ICD-10-CM

## 2018-08-31 NOTE — Progress Notes (Signed)
Office Visit Note   Patient: Donald [name]           Date of Birth: 11/14/1950           MRN: [id number] Visit Date: 08/31/2018              Requested by: Claybon JabsBrown, Emily, PA-C [address] [place], [place] [id number] PCP: Claybon JabsBrown, Emily, PA-C  Chief Complaint  Patient presents with  . Left Leg - Follow-up    12/25/17 left BKA       HPI: The patient is a 68 yo gentleman who is seen for follow up of his left transtibial amputation from 12/25/2017. He reports that the residual limb has been shrinking and he is now having problems with the prosthesis slipping and rotating in the prosthesis. He reports he is feeling unstable with ambulation. His prosthesis was fabricated by [facility]. He reports he has been trying to wear multiple ply socks but the prosthesis is still unstable.  Assessment & Plan: Visit Diagnoses:  1. Below-knee amputation of left lower extremity (HCC)   2. Diabetic polyneuropathy associated with type 2 diabetes mellitus (HCC)   3. Renal insufficiency, mild     Plan:Patient was given a prescription for [employer] to reassess his prosthesis for new socket, liner and materials and supplies.  He will follow up as needed.    Follow-Up Instructions: Return if symptoms worsen or fail to improve.   Ortho Exam  Patient is alert, oriented, no adenopathy, well-dressed, normal affect, normal respiratory effort. The left transtibial amputation is well healed and consolidated and without signs of breakdown or signs of infection or cellulitis. He has full extension of the knee.   Imaging: No results found. No images are attached to the encounter.  Labs: Lab Results  Component Value Date   HGBA1C 5.6 12/22/2017   ESRSEDRATE 129 (H) 12/22/2017   CRP 26.8 (H) 12/22/2017   REPTSTATUS 12/27/2017 FINAL 12/21/2017   CULT  12/21/2017    NO GROWTH 5 DAYS Performed at [facility], [address] [place], [place] [address]      Lab Results  Component Value Date   ALBUMIN 1.7 (L) 12/26/2017   ALBUMIN 1.7 (L) 12/25/2017   ALBUMIN 1.7 (L) 12/24/2017   PREALBUMIN <5 (L) 12/22/2017    Body mass index is 25.46 kg/m.  Orders:  No orders of the defined types were placed in this encounter.  No orders of the defined types were placed in this encounter.    Procedures: No procedures performed  Clinical Data: No additional findings.  ROS:  All other systems negative, except as noted in the HPI. Review of Systems  Objective: Vital Signs: Ht 5\' 5"  (1.651 m)   Wt 153 lb (69.4 kg)   BMI 25.46 kg/m   Specialty Comments:  No specialty comments available.  PMFS History: Patient Active Problem List   Diagnosis Date Noted  . Below-knee amputation of left lower extremity (HCC)   . Postoperative pain   . Type II diabetes mellitus (HCC) 12/22/2017  . Essential hypertension 12/22/2017  . Renal insufficiency, mild 12/22/2017  . Hyponatremia 12/22/2017  . Hyperbilirubinemia 12/22/2017  . Severe protein-calorie malnutrition (HCC)   . Diabetic polyneuropathy associated with type 2 diabetes mellitus (HCC)    Past Medical History:  Diagnosis Date  . Diabetic peripheral neuropathy (HCC)   . Gangrene of left foot (HCC)   . High cholesterol   . Hypertension   . OSA on CPAP   .  Type II diabetes mellitus (HCC)     History reviewed. No pertinent family history.  Past Surgical History:  Procedure Laterality Date  . AMPUTATION Left 12/25/2017   Procedure: LEFT BELOW KNEE AMPUTATION;  Surgeon: Nadara Mustard, MD;  Location: [facility] OR;  Service: Orthopedics;  Laterality: Left;  . HEEL SPUR EXCISION Bilateral 1992  . ROTATOR CUFF REPAIR Left 1990s   Social History   Occupational History  . Not on file  Tobacco Use  . Smoking status: Never Smoker  . Smokeless tobacco: Never Used  Substance and Sexual Activity  . Alcohol use: Not Currently    Frequency: Never  . Drug use: Never  . Sexual activity: Not Currently

## 2020-05-07 IMAGING — CT CT HEAD W/O CM
3 series · 15 of 47 positions shown, 18 images · non-contrast
Comparison: MRI brain dated 10/30/2017

CLINICAL DATA: Fevers, chills, nausea/vomiting

EXAM:
CT HEAD WITHOUT CONTRAST
TECHNIQUE: Contiguous axial images were obtained from the base of the skull
through the vertex without intravenous contrast.

[Series 2: head wo · axial · 0.42mm/px · z∈[-163,-38]mm · 9 of 31 slices shown, 12 images]
[im 3/31  brain]
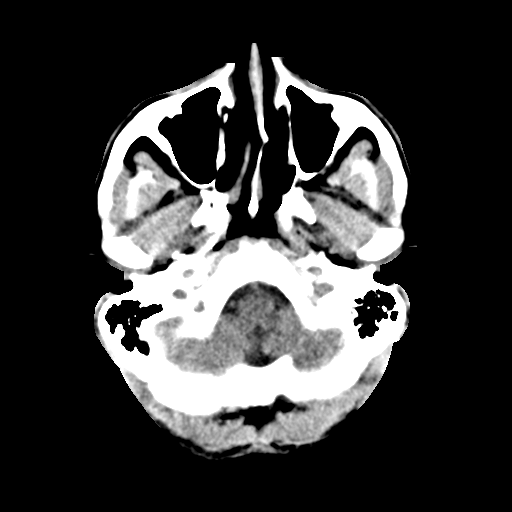
[im 3/31  bone]
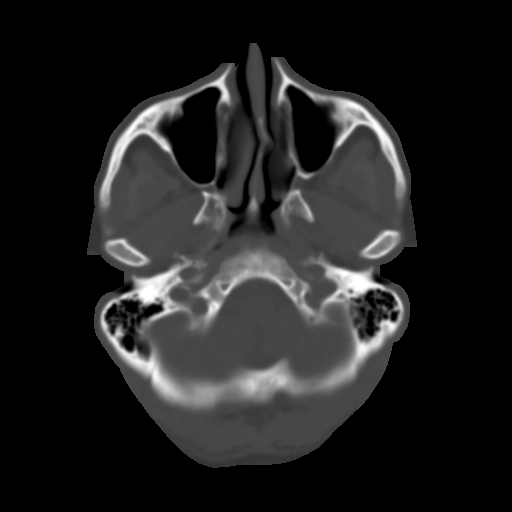
[im 6/31  brain]
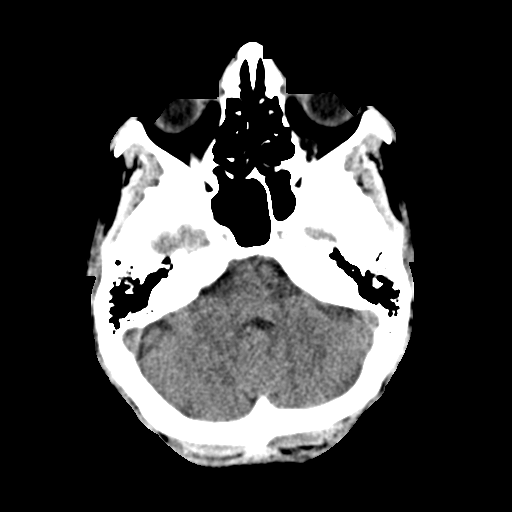
[im 9/31  brain]
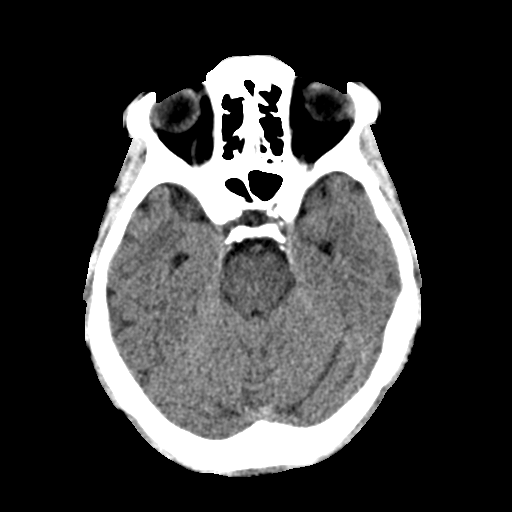
[im 12/31  brain]
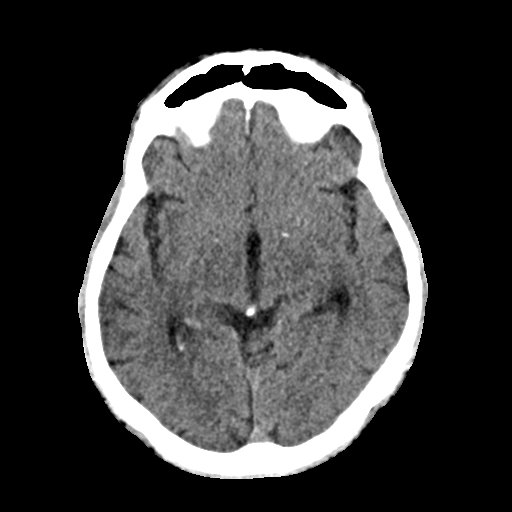
[im 16/31  brain]
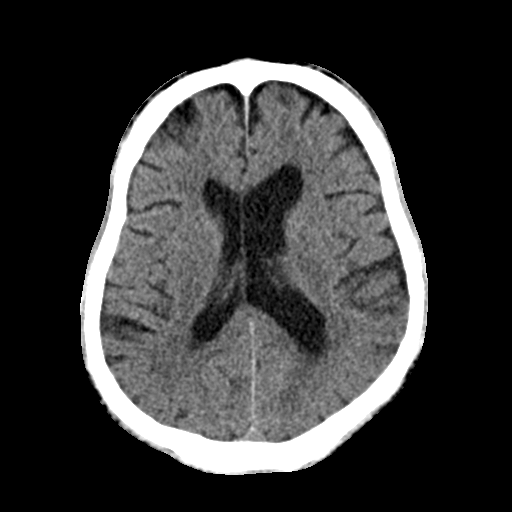
[im 16/31  bone]
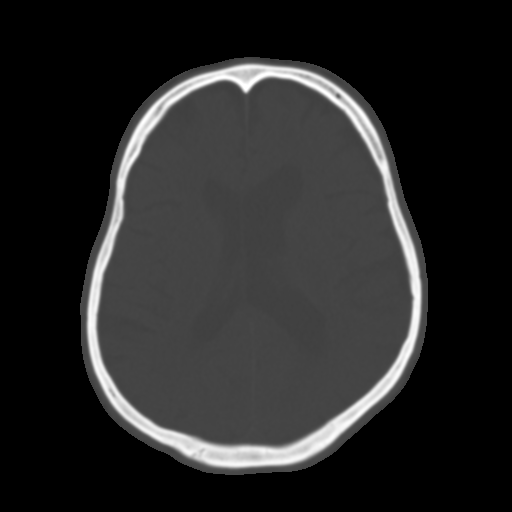
[im 19/31  brain]
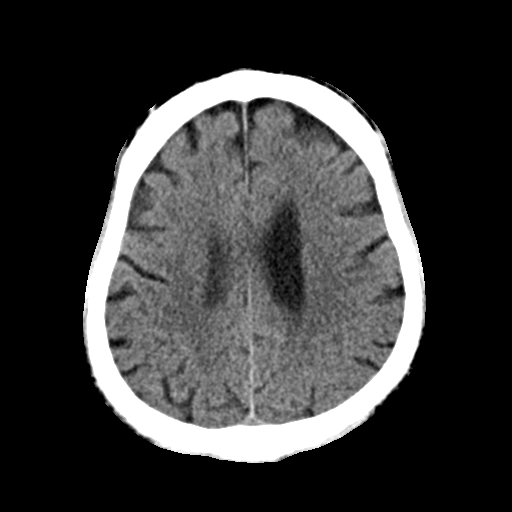
[im 22/31  brain]
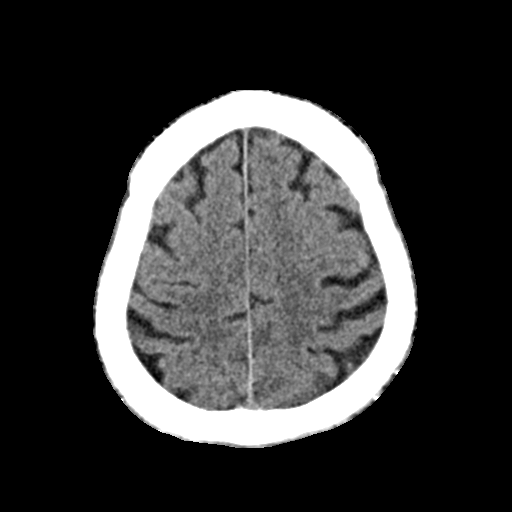
[im 25/31  brain]
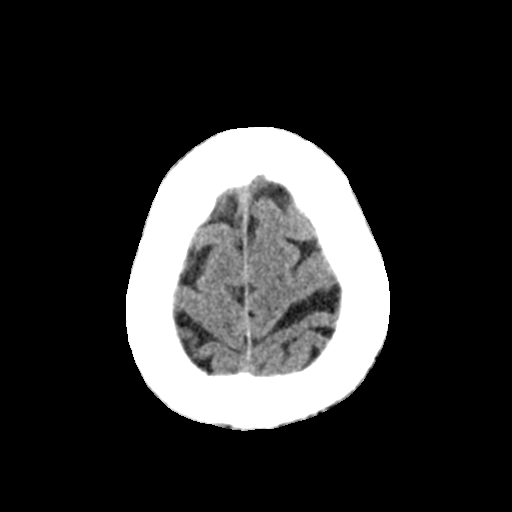
[im 28/31  brain]
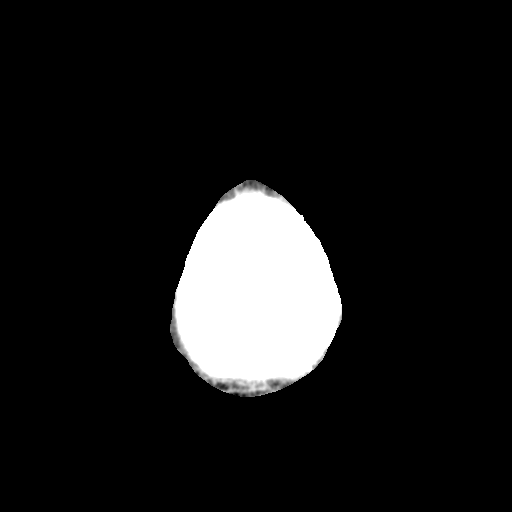
[im 28/31  bone]
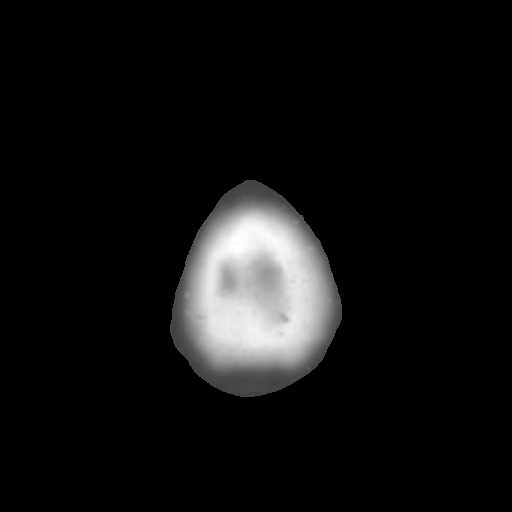

[Series 4: coronal soft · coronal · 0.30mm/px · 3 of 66 slices shown]
[im 22/66  brain]
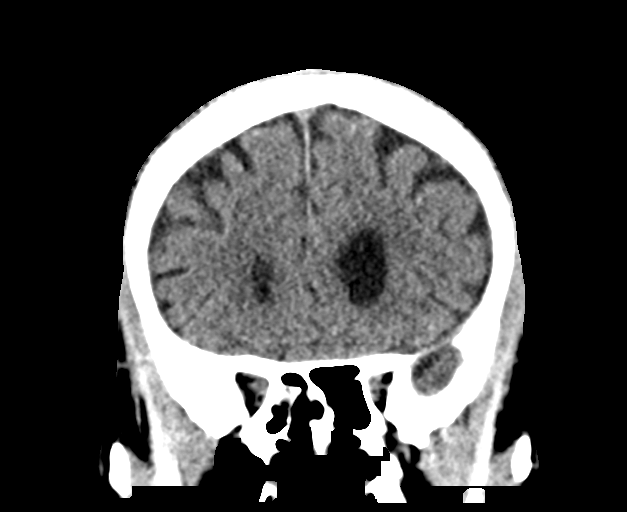
[im 29/66  brain]
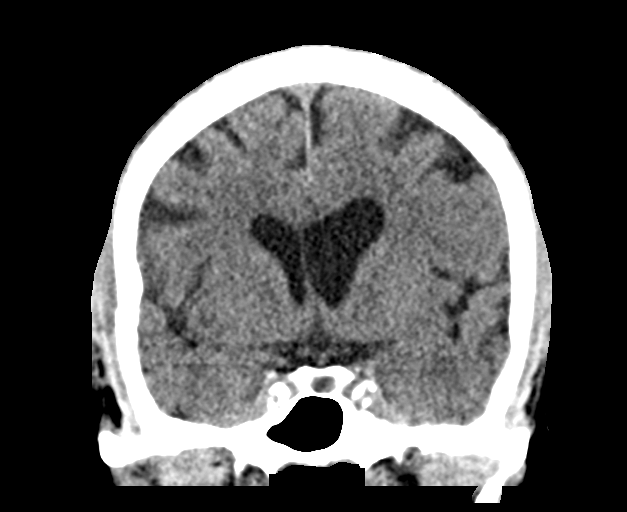
[im 37/66  brain]
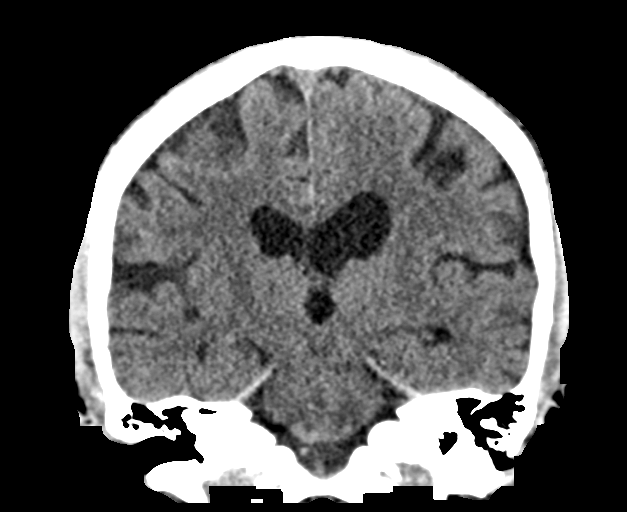

[Series 5: sag soft · sagittal · 0.31mm/px · 3 of 54 slices shown]
[im 18/54  brain]
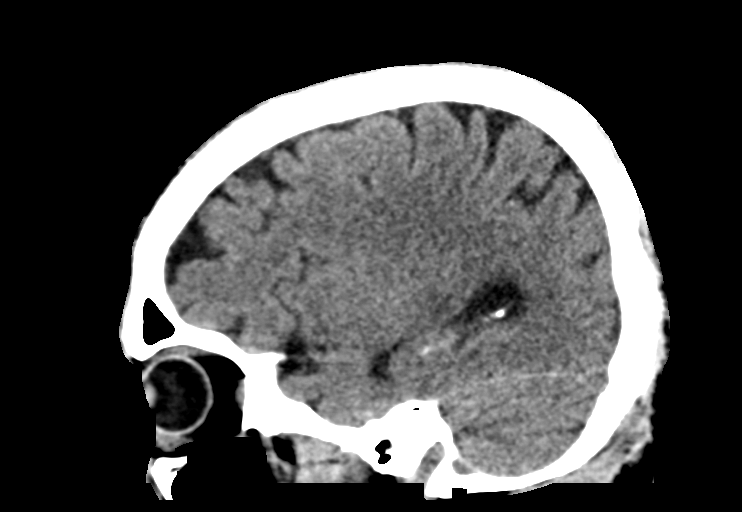
[im 27/54  brain]
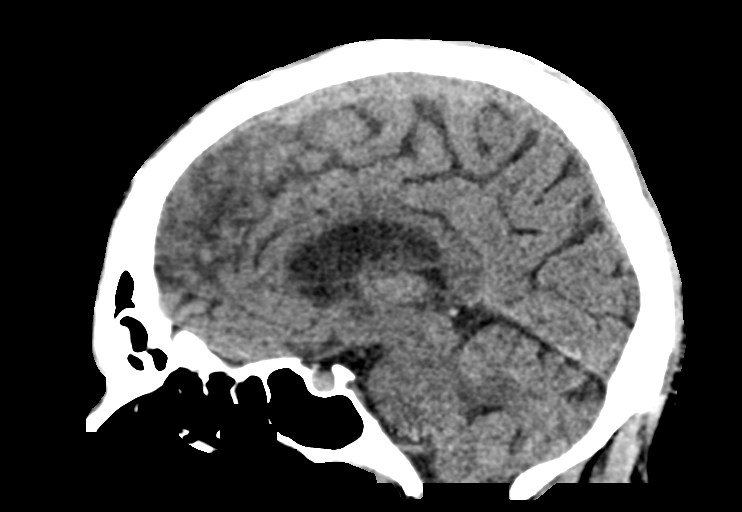
[im 36/54  brain]
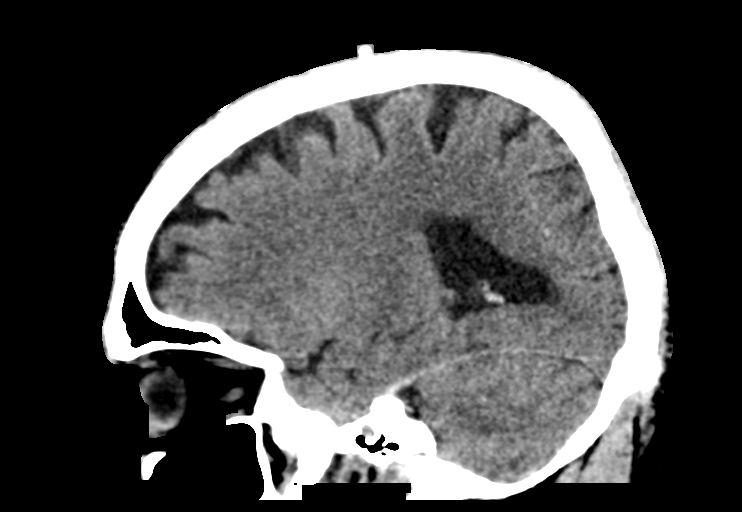

[15 of 47 positions shown; findings below may reference images not displayed]

FINDINGS: Brain: No evidence of acute infarction, hemorrhage, hydrocephalus,
extra-axial collection or mass lesion/mass effect.

Mild cortical atrophy.

Mild subcortical white matter and periventricular small vessel
ischemic changes.

Vascular: Intracranial atherosclerosis.

Skull: Normal. Negative for fracture or focal lesion.

Sinuses/Orbits: The visualized paranasal sinuses are essentially
clear. The mastoid air cells are unopacified.

Other: None.
IMPRESSION: No evidence of acute intracranial abnormality.

Mild atrophy with small vessel ischemic changes.

## 2020-05-07 IMAGING — DX DG CHEST 2V
2 series · 2 of 2 positions shown · non-contrast
Comparison: None.

CLINICAL DATA: Chills, fever and weakness.

EXAM:
CHEST - 2 VIEW

[chest lat]
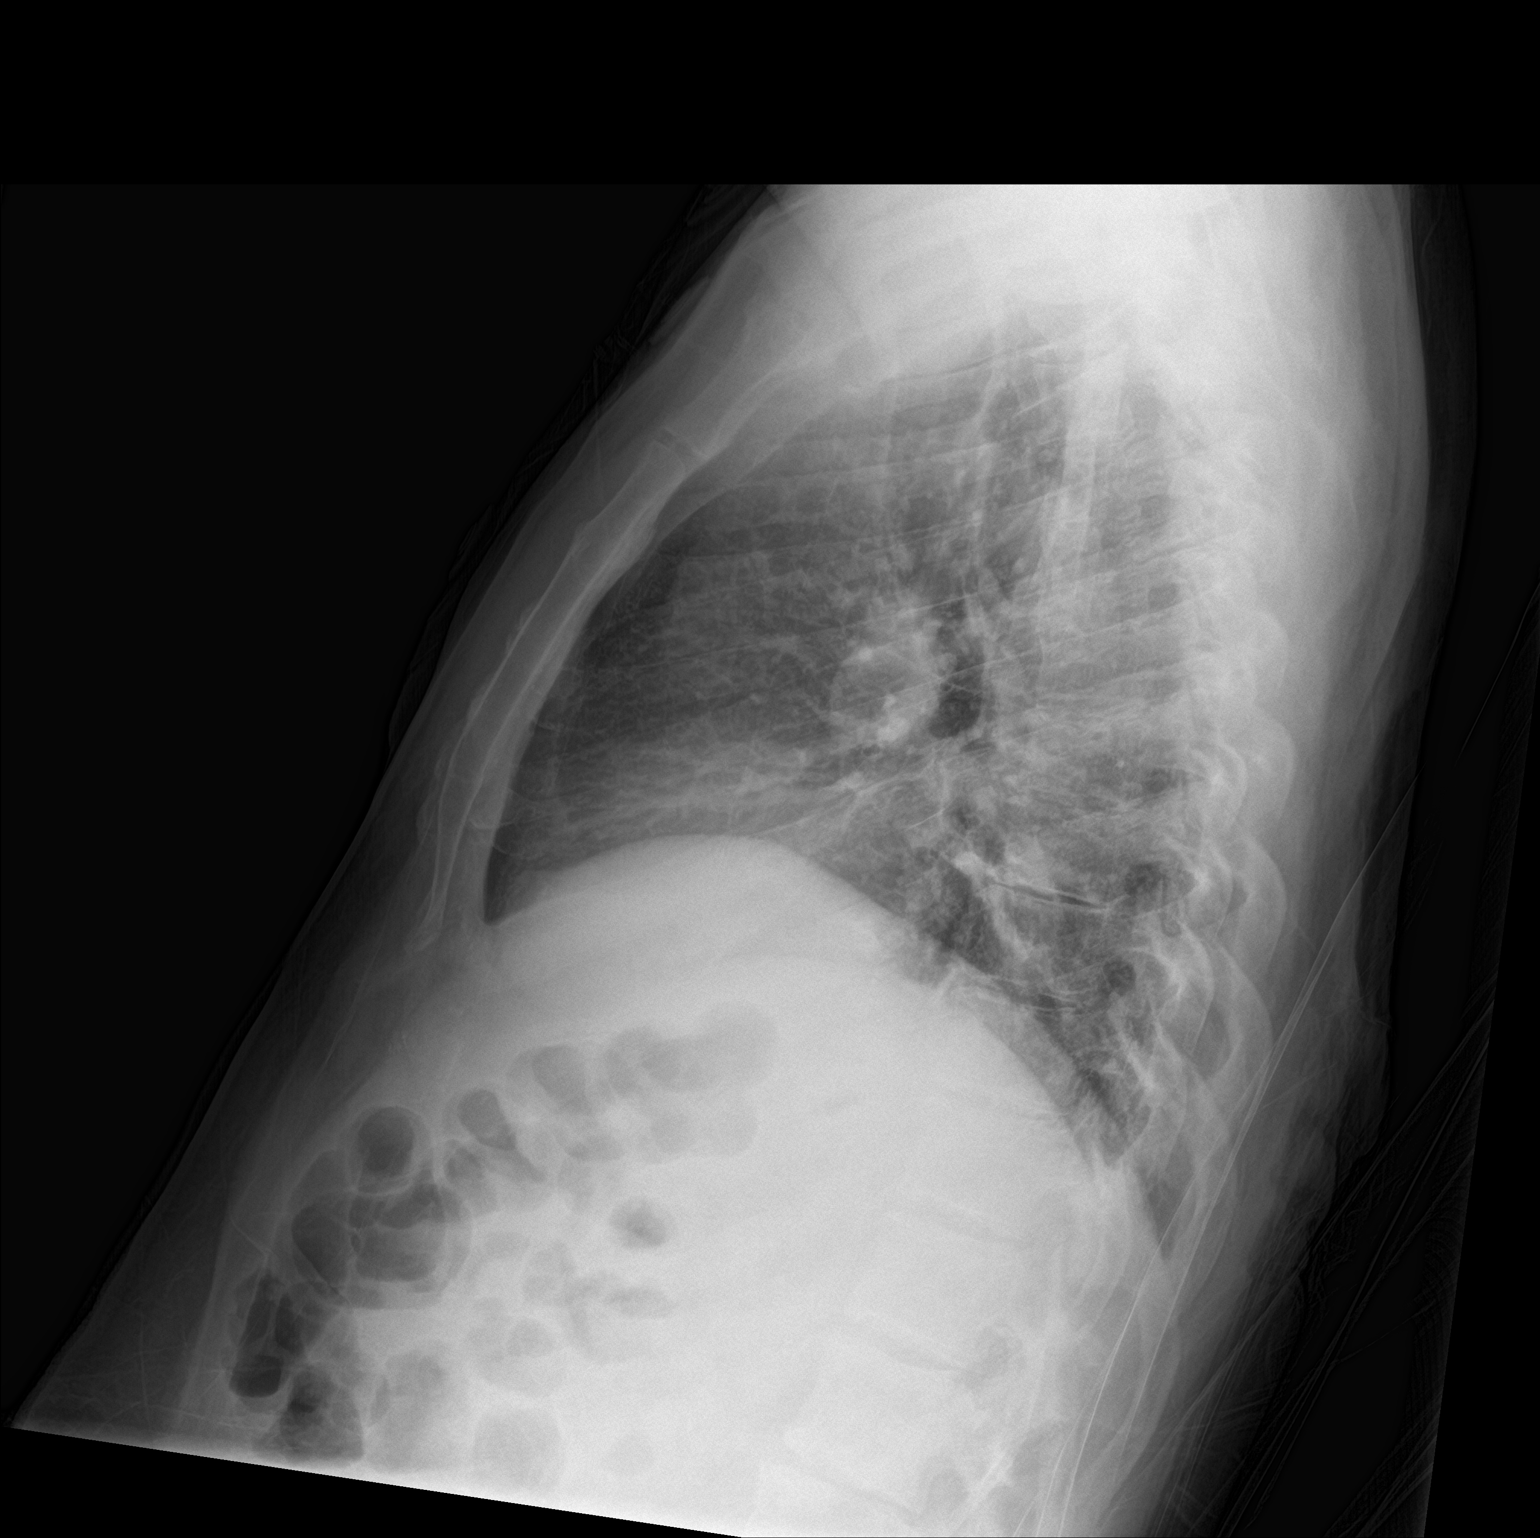

[chest ap strecther]
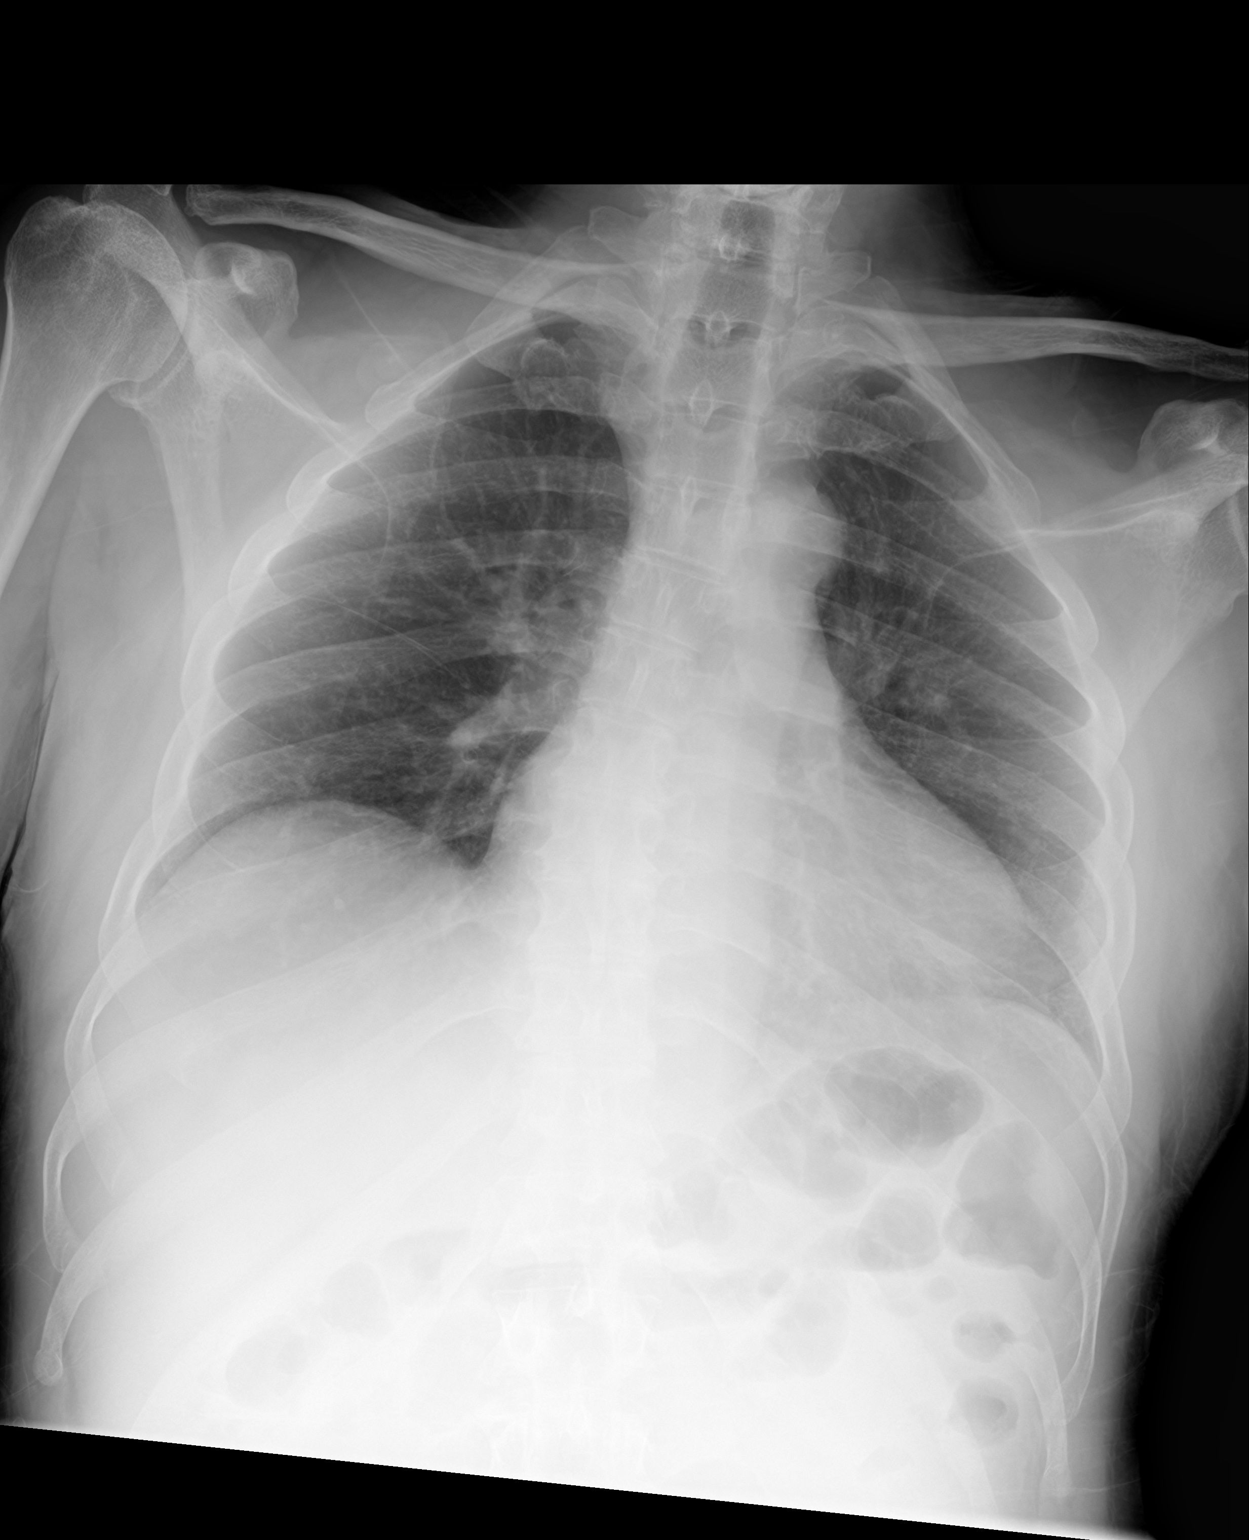

[2 of 2 positions shown; findings below may reference images not displayed]

FINDINGS: Cardiac silhouette is upper limits of normal size. Mediastinal
silhouette is not suspicious. No pleural effusion or focal
consolidation. Mild bronchitic changes. No pneumothorax. Soft tissue
planes and included osseous structures are non suspicious. Lower
thoracic dextroscoliosis could be positional. Chronic fragmentation
RIGHT AC joint.
IMPRESSION: 1. Borderline cardiomegaly.
2. Bronchitic changes without focal consolidation.

## 2020-10-29 ENCOUNTER — Ambulatory Visit (INDEPENDENT_AMBULATORY_CARE_PROVIDER_SITE_OTHER): Payer: Medicare Other | Admitting: Physician Assistant

## 2020-10-29 ENCOUNTER — Encounter: Payer: Self-pay | Admitting: Orthopedic Surgery

## 2020-10-29 ENCOUNTER — Other Ambulatory Visit: Payer: Self-pay

## 2020-10-29 DIAGNOSIS — Z89512 Acquired absence of left leg below knee: Secondary | ICD-10-CM | POA: Diagnosis not present

## 2020-10-29 DIAGNOSIS — S88112A Complete traumatic amputation at level between knee and ankle, left lower leg, initial encounter: Secondary | ICD-10-CM

## 2020-10-29 NOTE — Progress Notes (Signed)
Office Visit Note   Patient: Donald [name]           Date of Birth: 1951/04/07           MRN: [id number] Visit Date: 10/29/2020              Requested by: [name], PA-C [address] [place],  [place] [address] PCP: [name], PA-C  Chief Complaint  Patient presents with  . Left Knee - Follow-up      HPI: Patient is a pleasant 70 year old gentleman who is 3 years status post left below-knee amputation.  He is getting very unstable because his prosthetic has gotten too big because of volume loss in his stump.  He has multiple socks.  Liner is also become worn.  Assessment & Plan: Visit Diagnoses: No diagnosis found.  Plan: Patient was provided a new prescription for a K3 prosthetic supplies liner may follow-up as needed  Follow-Up Instructions: No follow-ups on file.   Ortho Exam  Patient is alert, oriented, no adenopathy, well-dressed, normal affect, normal respiratory effort. Patient is wearing multiple socks in addition to the liner.  He has significant spacing between the liner and the prosthetic.  Skin is intact.  No signs of infection. Patient is an existing left transtibial  amputee.  Patient's current comorbidities are not expected to impact the ability to function with the prescribed prosthesis. Patient verbally communicates a strong desire to use a prosthesis. Patient currently requires mobility aids to ambulate without a prosthesis.  Expects not to use mobility aids with a new prosthesis.  Patient is a K3 level ambulator that spends a lot of time walking around on uneven terrain over obstacles, up and down stairs, and ambulates with a variable cadence.   Imaging: No results found. No images are attached to the encounter.  Labs: Lab Results  Component Value Date   HGBA1C 5.6 12/22/2017   ESRSEDRATE 129 (H) 12/22/2017   CRP 26.8 (H) 12/22/2017   REPTSTATUS 12/27/2017 FINAL 12/21/2017   CULT  12/21/2017    NO GROWTH 5 DAYS Performed at [facility], [address], [place], [place] [address]      Lab Results  Component Value Date   ALBUMIN 1.7 (L) 12/26/2017   ALBUMIN 1.7 (L) 12/25/2017   ALBUMIN 1.7 (L) 12/24/2017   PREALBUMIN <5 (L) 12/22/2017    No results found for: MG No results found for: VD25OH  Lab Results  Component Value Date   PREALBUMIN <5 (L) 12/22/2017   CBC EXTENDED Latest Ref Rng & Units 12/27/2017 12/26/2017 12/25/2017  WBC 4.0 - 10.5 K/uL 16.5(H) 17.8(H) 28.3(H)  RBC 4.22 - 5.81 MIL/uL 2.71(L) 2.71(L) 2.84(L)  HGB 13.0 - 17.0 g/dL 8.3(L) 8.5(L) 8.8(L)  HCT 39.0 - 52.0 % 25.5(L) 24.7(L) 25.9(L)  PLT 150 - 400 K/uL 542(H) 435(H) 451(H)  NEUTROABS 1.7 - 7.7 K/uL - 13.9(H) 23.8(H)  LYMPHSABS 0.7 - 4.0 K/uL - 1.6 1.7     There is no height or weight on file to calculate BMI.  Orders:  No orders of the defined types were placed in this encounter.  No orders of the defined types were placed in this encounter.    Procedures: No procedures performed  Clinical Data: No additional findings.  ROS:  All other systems negative, except as noted in the HPI. Review of Systems  Objective: Vital Signs: There were no vitals taken for this visit.  Specialty Comments:  No specialty comments available.  PMFS History: Patient  Active Problem List   Diagnosis Date Noted  . Below-knee amputation of left lower extremity (HCC)   . Postoperative pain   . Type II diabetes mellitus (HCC) 12/22/2017  . Essential hypertension 12/22/2017  . Renal insufficiency, mild 12/22/2017  . Hyponatremia 12/22/2017  . Hyperbilirubinemia 12/22/2017  . Severe protein-calorie malnutrition (HCC)   . Diabetic polyneuropathy associated with type 2 diabetes mellitus (HCC)    Past Medical History:  Diagnosis Date  . Diabetic peripheral neuropathy (HCC)   . Gangrene of left foot (HCC)   . High cholesterol   . Hypertension   . OSA on CPAP   . Type II diabetes mellitus (HCC)     History reviewed. No pertinent family  history.  Past Surgical History:  Procedure Laterality Date  . AMPUTATION Left 12/25/2017   Procedure: LEFT BELOW KNEE AMPUTATION;  Surgeon: Nadara Mustard, MD;  Location: [facility] OR;  Service: Orthopedics;  Laterality: Left;  . HEEL SPUR EXCISION Bilateral 1992  . ROTATOR CUFF REPAIR Left 1990s   Social History   Occupational History  . Not on file  Tobacco Use  . Smoking status: Never Smoker  . Smokeless tobacco: Never Used  Vaping Use  . Vaping Use: Never used  Substance and Sexual Activity  . Alcohol use: Not Currently  . Drug use: Never  . Sexual activity: Not Currently

## 2020-12-12 ENCOUNTER — Telehealth: Payer: Self-pay | Admitting: Physician Assistant

## 2020-12-12 NOTE — Telephone Encounter (Signed)
10/29/20 ov note faxed [facility] [phone]

## 2022-03-17 ENCOUNTER — Telehealth: Payer: Self-pay | Admitting: Orthopedic Surgery

## 2022-03-17 NOTE — Telephone Encounter (Signed)
switching to [employer]...[phone]

## 2022-03-17 NOTE — Telephone Encounter (Signed)
Noted  

## 2022-03-17 NOTE — Telephone Encounter (Signed)
He hasn't been seen since June 2022. Insurance requires an appointment within 6 months of a new Rx. Please call him to schedule an appt. Thank you!

## 2022-03-17 NOTE — Telephone Encounter (Signed)
Pt came in wanting an new rx for his prosthetic leg and was told to come her.Marland KitchenMarland Kitchen

## 2022-03-17 NOTE — Telephone Encounter (Signed)
Follow up appt made for Monday 03/24/22 @ 9:45 AM

## 2022-03-24 ENCOUNTER — Ambulatory Visit (INDEPENDENT_AMBULATORY_CARE_PROVIDER_SITE_OTHER): Payer: Medicare Other | Admitting: Orthopedic Surgery

## 2022-03-24 DIAGNOSIS — Z89511 Acquired absence of right leg below knee: Secondary | ICD-10-CM | POA: Diagnosis not present

## 2022-03-24 DIAGNOSIS — S88112A Complete traumatic amputation at level between knee and ankle, left lower leg, initial encounter: Secondary | ICD-10-CM

## 2022-03-25 ENCOUNTER — Encounter: Payer: Self-pay | Admitting: Orthopedic Surgery

## 2022-03-25 NOTE — Progress Notes (Signed)
Office Visit Note   Patient: Donald [name]           Date of Birth: December 21, 1950           MRN: [id number] Visit Date: 03/24/2022              Requested by: [name], PA-C [address] [place],  [place] [address] PCP: [name], PA-C  Chief Complaint  Patient presents with   Left Leg - Follow-up    Hx BKA 2019       HPI: Patient is a 71 year old gentleman who is seen for evaluation for a loose fitting prosthesis left transtibial amputation.  Patient is 4 years out from his amputation.  Patient states he is having skin breakdown due to the subsiding into his socket.  Assessment & Plan: Visit Diagnoses:  1. Below-knee amputation of left lower extremity (HCC)     Plan: Prescription was provided for [employer] for new socket liner materials and supplies.  Follow-Up Instructions: Return if symptoms worsen or fail to improve.   Ortho Exam  Patient is alert, oriented, no adenopathy, well-dressed, normal affect, normal respiratory effort. Patiently currently wears soccer shin guards bilaterally.  Examination of the left leg he has had significant decrease in the residual volume.  He has rough callused skin from subsiding into the socket.  The liner is torn.  Patient is an existing left transtibial  amputee.  Patient's current comorbidities are not expected to impact the ability to function with the prescribed prosthesis. Patient verbally communicates a strong desire to use a prosthesis. Patient currently requires mobility aids to ambulate without a prosthesis.  Expects not to use mobility aids with a new prosthesis.  Patient is a K3 level ambulator that spends a lot of time walking around on uneven terrain over obstacles, up and down stairs, and ambulates with a variable cadence.     Imaging: No results found. No images are attached to the encounter.  Labs: Lab Results  Component Value Date   HGBA1C 5.6 12/22/2017   ESRSEDRATE 129 (H) 12/22/2017   CRP 26.8 (H)  12/22/2017   REPTSTATUS 12/27/2017 FINAL 12/21/2017   CULT  12/21/2017    NO GROWTH 5 DAYS Performed at [facility], [address] [place], [place] [address]      Lab Results  Component Value Date   ALBUMIN 1.7 (L) 12/26/2017   ALBUMIN 1.7 (L) 12/25/2017   ALBUMIN 1.7 (L) 12/24/2017   PREALBUMIN <5 (L) 12/22/2017    No results found for: "MG" No results found for: "VD25OH"  Lab Results  Component Value Date   PREALBUMIN <5 (L) 12/22/2017      Latest Ref Rng & Units 12/27/2017    7:22 AM 12/26/2017    5:55 AM 12/25/2017    5:34 AM  CBC EXTENDED  WBC 4.0 - 10.5 K/uL 16.5  17.8  28.3   RBC 4.22 - 5.81 MIL/uL 2.71  2.71  2.84   Hemoglobin 13.0 - 17.0 g/dL 8.3  8.5  8.8   HCT 16.6 - 52.0 % 25.5  24.7  25.9   Platelets 150 - 400 K/uL 542  435  451   NEUT# 1.7 - 7.7 K/uL  13.9  23.8   Lymph# 0.7 - 4.0 K/uL  1.6  1.7      There is no height or weight on file to calculate BMI.  Orders:  No orders of the defined types were placed in this encounter.  No orders of  the defined types were placed in this encounter.    Procedures: No procedures performed  Clinical Data: No additional findings.  ROS:  All other systems negative, except as noted in the HPI. Review of Systems  Objective: Vital Signs: There were no vitals taken for this visit.  Specialty Comments:  No specialty comments available.  PMFS History: Patient Active Problem List   Diagnosis Date Noted   Below-knee amputation of left lower extremity ([place])    Postoperative pain    Type II diabetes mellitus ([place]) 12/22/2017   Essential hypertension 12/22/2017   Renal insufficiency, mild 12/22/2017   Hyponatremia 12/22/2017   Hyperbilirubinemia 12/22/2017   Severe protein-calorie malnutrition (HCC)    Diabetic polyneuropathy associated with type 2 diabetes mellitus ([place])    Past Medical History:  Diagnosis Date   Diabetic peripheral neuropathy (HCC)    Gangrene of left foot (HCC)    High  cholesterol    Hypertension    OSA on CPAP    Type II diabetes mellitus ([place])     History reviewed. No pertinent family history.  Past Surgical History:  Procedure Laterality Date   AMPUTATION Left 12/25/2017   Procedure: LEFT BELOW KNEE AMPUTATION;  Surgeon: Newt Minion, MD;  Location: [facility];  Service: Orthopedics;  Laterality: Left;   HEEL SPUR EXCISION Bilateral 1992   ROTATOR CUFF REPAIR Left 1990s   Social History   Occupational History   Not on file  Tobacco Use   Smoking status: Never   Smokeless tobacco: Never  Vaping Use   Vaping Use: Never used  Substance and Sexual Activity   Alcohol use: Not Currently   Drug use: Never   Sexual activity: Not Currently

## 2022-06-05 ENCOUNTER — Ambulatory Visit (INDEPENDENT_AMBULATORY_CARE_PROVIDER_SITE_OTHER): Payer: Medicare Other | Admitting: Orthopedic Surgery

## 2022-06-05 ENCOUNTER — Telehealth: Payer: Self-pay | Admitting: Orthopedic Surgery

## 2022-06-05 DIAGNOSIS — Z89512 Acquired absence of left leg below knee: Secondary | ICD-10-CM | POA: Diagnosis not present

## 2022-06-05 DIAGNOSIS — S88112A Complete traumatic amputation at level between knee and ankle, left lower leg, initial encounter: Secondary | ICD-10-CM

## 2022-06-05 DIAGNOSIS — L97921 Non-pressure chronic ulcer of unspecified part of left lower leg limited to breakdown of skin: Secondary | ICD-10-CM | POA: Diagnosis not present

## 2022-06-05 NOTE — Telephone Encounter (Signed)
I am working on this. Will call him once completed.

## 2022-06-05 NOTE — Telephone Encounter (Signed)
Patient states he has a sore on his stump and wants to be seen today..sending call to Triage.Marland Kitchen

## 2022-06-05 NOTE — Telephone Encounter (Signed)
Donald [name] came to inform us and Donald [name] is being seen today

## 2022-06-05 NOTE — Telephone Encounter (Signed)
Pt is coming in for an appointment and will give to him in office.

## 2022-06-05 NOTE — Telephone Encounter (Signed)
[employer] received vm from patient. He is asking if his handicap parking application is ready. Pts callback [phone]

## 2022-06-15 ENCOUNTER — Encounter: Payer: Self-pay | Admitting: Orthopedic Surgery

## 2022-06-15 NOTE — Progress Notes (Signed)
Office Visit Note   Patient: Donald [name]           Date of Birth: 11-28-1950           MRN: [id number] Visit Date: 06/05/2022              Requested by: Heber [place] Telemark, PA-C [address] [place],  [place] [address] PCP: Heber Longwood, PA-C  Chief Complaint  Patient presents with   Left Leg - Wound Check      HPI: Patient is a 72 year old gentleman who states that his prosthetic socket has caused a sore on the residual limb.  Patient has been following with bionics.  Assessment & Plan: Visit Diagnoses:  1. Below-knee amputation of left lower extremity ([place])   2. Ulcer of left lower extremity, limited to breakdown of skin ([place])     Plan: Patient was given an XL prostatic under liner recommended avoiding wearing his prosthesis until this resolves.  Will need a new socket liner materials and supplies.  Follow-Up Instructions: Return in about 4 weeks (around 07/03/2022).   Ortho Exam  Patient is alert, oriented, no adenopathy, well-dressed, normal affect, normal respiratory effort. Examination patient has a macerated wound over the residual limb that is 3 cm in diameter 1 mm deep.  This does not probe to bone or tendon.  There is no surrounding cellulitis no fluctuance.  Patient has been end bearing on the residual limb in his prosthesis.  Imaging: No results found.   Labs: Lab Results  Component Value Date   HGBA1C 5.6 12/22/2017   ESRSEDRATE 129 (H) 12/22/2017   CRP 26.8 (H) 12/22/2017   REPTSTATUS 12/27/2017 FINAL 12/21/2017   CULT  12/21/2017    NO GROWTH 5 DAYS Performed at [facility], [place] [address], [place], [place] [address]      Lab Results  Component Value Date   ALBUMIN 1.7 (L) 12/26/2017   ALBUMIN 1.7 (L) 12/25/2017   ALBUMIN 1.7 (L) 12/24/2017   PREALBUMIN <5 (L) 12/22/2017    No results found for: "MG" No results found for: "VD25OH"  Lab Results  Component Value Date   PREALBUMIN <5 (L) 12/22/2017      Latest Ref Rng & Units  12/27/2017    7:22 AM 12/26/2017    5:55 AM 12/25/2017    5:34 AM  CBC EXTENDED  WBC 4.0 - 10.5 K/uL 16.5  17.8  28.3   RBC 4.22 - 5.81 MIL/uL 2.71  2.71  2.84   Hemoglobin 13.0 - 17.0 g/dL 8.3  8.5  8.8   HCT 39.0 - 52.0 % 25.5  24.7  25.9   Platelets 150 - 400 K/uL 542  435  451   NEUT# 1.7 - 7.7 K/uL  13.9  23.8   Lymph# 0.7 - 4.0 K/uL  1.6  1.7      There is no height or weight on file to calculate BMI.  Orders:  No orders of the defined types were placed in this encounter.  No orders of the defined types were placed in this encounter.    Procedures: No procedures performed  Clinical Data: No additional findings.  ROS:  All other systems negative, except as noted in the HPI. Review of Systems  Objective: Vital Signs: There were no vitals taken for this visit.  Specialty Comments:  No specialty comments available.  PMFS History: Patient Active Problem List   Diagnosis Date Noted   Below-knee amputation of left lower extremity ([place])  Postoperative pain    Type II diabetes mellitus ([place]) 12/22/2017   Essential hypertension 12/22/2017   Renal insufficiency, mild 12/22/2017   Hyponatremia 12/22/2017   Hyperbilirubinemia 12/22/2017   Severe protein-calorie malnutrition (HCC)    Diabetic polyneuropathy associated with type 2 diabetes mellitus ([place])    Past Medical History:  Diagnosis Date   Diabetic peripheral neuropathy (HCC)    Gangrene of left foot (HCC)    High cholesterol    Hypertension    OSA on CPAP    Type II diabetes mellitus ([place])     No family history on file.  Past Surgical History:  Procedure Laterality Date   AMPUTATION Left 12/25/2017   Procedure: LEFT BELOW KNEE AMPUTATION;  Surgeon: Newt Minion, MD;  Location: [facility];  Service: Orthopedics;  Laterality: Left;   HEEL SPUR EXCISION Bilateral 1992   ROTATOR CUFF REPAIR Left 1990s   Social History   Occupational History   Not on file  Tobacco Use   Smoking status: Never   Smokeless  tobacco: Never  Vaping Use   Vaping Use: Never used  Substance and Sexual Activity   Alcohol use: Not Currently   Drug use: Never   Sexual activity: Not Currently

## 2022-07-03 ENCOUNTER — Encounter: Payer: Self-pay | Admitting: Orthopedic Surgery

## 2022-07-03 ENCOUNTER — Ambulatory Visit: Payer: Medicare Other | Admitting: Orthopedic Surgery

## 2022-07-03 DIAGNOSIS — Z89512 Acquired absence of left leg below knee: Secondary | ICD-10-CM | POA: Diagnosis not present

## 2022-07-03 DIAGNOSIS — S88112A Complete traumatic amputation at level between knee and ankle, left lower leg, initial encounter: Secondary | ICD-10-CM

## 2022-07-03 DIAGNOSIS — L97921 Non-pressure chronic ulcer of unspecified part of left lower leg limited to breakdown of skin: Secondary | ICD-10-CM

## 2022-07-03 NOTE — Progress Notes (Signed)
Office Visit Note   Patient: Donald [name]           Date of Birth: 1951/04/20           MRN: [id number] Visit Date: 07/03/2022              Requested by: Heber Orason, PA-C [address] 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  Patient has a new polyethylene liner in the socket has been wearing the prosthetic under liner.  Assessment & Plan: Visit Diagnoses:  1. Below-knee amputation of left lower extremity ([place])   2. Ulcer of left lower extremity, limited to breakdown of skin ([place])     Plan: Continue with current treatment the ulcer is improving.  Recommended minimizing time in the prosthesis.    Follow-Up Instructions: Return in about 4 weeks (around 07/31/2022).   Ortho Exam  Patient is alert, oriented, no adenopathy, well-dressed, normal affect, normal respiratory effort. Examination the wound shows no maceration there is no drainage.  The ulcer is 10 mm in diameter 1 mm deep with healthy granulation tissue.  Imaging: No results found.   Labs: Lab Results  Component Value Date   HGBA1C 5.6 12/22/2017   ESRSEDRATE 129 (H) 12/22/2017   CRP 26.8 (H) 12/22/2017   REPTSTATUS 12/27/2017 FINAL 12/21/2017   CULT  12/21/2017    NO GROWTH 5 DAYS Performed at [facility], [place] [address], [place], [place] [address]      Lab Results  Component Value Date   ALBUMIN 1.7 (L) 12/26/2017   ALBUMIN 1.7 (L) 12/25/2017   ALBUMIN 1.7 (L) 12/24/2017   PREALBUMIN <5 (L) 12/22/2017    No results found for: "MG" No results found for: "VD25OH"  Lab Results  Component Value Date   PREALBUMIN <5 (L) 12/22/2017      Latest Ref Rng & Units 12/27/2017    7:22 AM 12/26/2017    5:55 AM 12/25/2017    5:34 AM  CBC EXTENDED  WBC  4.0 - 10.5 K/uL 16.5  17.8  28.3   RBC 4.22 - 5.81 MIL/uL 2.71  2.71  2.84   Hemoglobin 13.0 - 17.0 g/dL 8.3  8.5  8.8   HCT 39.0 - 52.0 % 25.5  24.7  25.9   Platelets 150 - 400 K/uL 542  435  451   NEUT# 1.7 - 7.7 K/uL  13.9  23.8   Lymph# 0.7 - 4.0 K/uL  1.6  1.7      There is no height or weight on file to calculate BMI.  Orders:  No orders of the defined types were placed in this encounter.  No orders of the defined types were placed in this encounter.    Procedures: No procedures performed  Clinical Data: No additional findings.  ROS:  All other systems negative, except as noted in the HPI. Review of Systems  Objective: Vital Signs: There were no vitals taken for this visit.  Specialty Comments:  No specialty comments available.  PMFS History: Patient Active Problem List   Diagnosis Date Noted   Below-knee amputation of left lower extremity ([place])    Postoperative pain    Type II diabetes mellitus ([place]) 12/22/2017   Essential  hypertension 12/22/2017   Renal insufficiency, mild 12/22/2017   Hyponatremia 12/22/2017   Hyperbilirubinemia 12/22/2017   Severe protein-calorie malnutrition (HCC)    Diabetic polyneuropathy associated with type 2 diabetes mellitus ([place])    Past Medical History:  Diagnosis Date   Diabetic peripheral neuropathy (HCC)    Gangrene of left foot (HCC)    High cholesterol    Hypertension    OSA on CPAP    Type II diabetes mellitus ([place])     History reviewed. No pertinent family history.  Past Surgical History:  Procedure Laterality Date   AMPUTATION Left 12/25/2017   Procedure: LEFT BELOW KNEE AMPUTATION;  Surgeon: Newt Minion, MD;  Location: [facility];  Service: Orthopedics;  Laterality: Left;   HEEL SPUR EXCISION Bilateral 1992   ROTATOR CUFF REPAIR Left 1990s   Social History   Occupational History   Not on file  Tobacco Use   Smoking status: Never   Smokeless tobacco: Never  Vaping Use   Vaping Use: Never used  Substance  and Sexual Activity   Alcohol use: Not Currently   Drug use: Never   Sexual activity: Not Currently

## 2022-07-28 ENCOUNTER — Ambulatory Visit: Payer: Medicare Other | Admitting: Orthopedic Surgery

## 2022-07-28 DIAGNOSIS — Z89512 Acquired absence of left leg below knee: Secondary | ICD-10-CM

## 2022-07-28 DIAGNOSIS — L97921 Non-pressure chronic ulcer of unspecified part of left lower leg limited to breakdown of skin: Secondary | ICD-10-CM

## 2022-07-28 DIAGNOSIS — S88112A Complete traumatic amputation at level between knee and ankle, left lower leg, initial encounter: Secondary | ICD-10-CM

## 2022-07-29 ENCOUNTER — Encounter: Payer: Self-pay | Admitting: Orthopedic Surgery

## 2022-07-29 NOTE — Progress Notes (Signed)
Office Visit Note   Patient: Donald [name]           Date of Birth: 1950-11-04           MRN: [id number] Visit Date: 07/28/2022              Requested by: Heber [place], PA-C [address] [place],  [place] [address] PCP: Heber [place], PA-C  Chief Complaint  Patient presents with   Left Leg - Follow-up    Hx left BKA      HPI: Patient is a 72 year old gentleman who presents in follow-up for end bearing ulcer left below-knee amputation from subsiding into his socket.  Patient presents with Wells Guiles who is his prosthetists.  Assessment & Plan: Visit Diagnoses:  1. Below-knee amputation of left lower extremity ([place])   2. Ulcer of left lower extremity, limited to breakdown of skin [facility])     Plan: Patient will continue with modifications including new liner materials and supplies.  Follow-Up Instructions: No follow-ups on file.   Ortho Exam  Patient is alert, oriented, no adenopathy, well-dressed, normal affect, normal respiratory effort. Examination the ulcer is smaller there is callus no cellulitis.  Patient has significant muscle atrophy in the residual limb.  Imaging: No results found. No images are attached to the encounter.  Labs: Lab Results  Component Value Date   HGBA1C 5.6 12/22/2017   ESRSEDRATE 129 (H) 12/22/2017   CRP 26.8 (H) 12/22/2017   REPTSTATUS 12/27/2017 FINAL 12/21/2017   CULT  12/21/2017    NO GROWTH 5 DAYS Performed at [facility], [place] [address], [place], [place] [address]      Lab Results  Component Value Date   ALBUMIN 1.7 (L) 12/26/2017   ALBUMIN 1.7 (L) 12/25/2017   ALBUMIN 1.7 (L) 12/24/2017   PREALBUMIN <5 (L) 12/22/2017    No results found for: "MG" No results found for: "VD25OH"  Lab Results  Component Value Date   PREALBUMIN <5 (L) 12/22/2017      Latest Ref Rng & Units 12/27/2017    7:22 AM 12/26/2017    5:55 AM 12/25/2017    5:34 AM  CBC EXTENDED  WBC 4.0 - 10.5 K/uL 16.5  17.8  28.3   RBC 4.22 - 5.81 MIL/uL  2.71  2.71  2.84   Hemoglobin 13.0 - 17.0 g/dL 8.3  8.5  8.8   HCT 39.0 - 52.0 % 25.5  24.7  25.9   Platelets 150 - 400 K/uL 542  435  451   NEUT# 1.7 - 7.7 K/uL  13.9  23.8   Lymph# 0.7 - 4.0 K/uL  1.6  1.7      There is no height or weight on file to calculate BMI.  Orders:  No orders of the defined types were placed in this encounter.  No orders of the defined types were placed in this encounter.    Procedures: No procedures performed  Clinical Data: No additional findings.  ROS:  All other systems negative, except as noted in the HPI. Review of Systems  Objective: Vital Signs: There were no vitals taken for this visit.  Specialty Comments:  No specialty comments available.  PMFS History: Patient Active Problem List   Diagnosis Date Noted   Below-knee amputation of left lower extremity ([place])    Postoperative pain    Type II diabetes mellitus ([place]) 12/22/2017   Essential hypertension 12/22/2017   Renal insufficiency, mild 12/22/2017   Hyponatremia 12/22/2017   Hyperbilirubinemia 12/22/2017  Severe protein-calorie malnutrition ([place])    Diabetic polyneuropathy associated with type 2 diabetes mellitus ([place])    Past Medical History:  Diagnosis Date   Diabetic peripheral neuropathy (HCC)    Gangrene of left foot (HCC)    High cholesterol    Hypertension    OSA on CPAP    Type II diabetes mellitus ([place])     History reviewed. No pertinent family history.  Past Surgical History:  Procedure Laterality Date   AMPUTATION Left 12/25/2017   Procedure: LEFT BELOW KNEE AMPUTATION;  Surgeon: Newt Minion, MD;  Location: [facility];  Service: Orthopedics;  Laterality: Left;   HEEL SPUR EXCISION Bilateral 1992   ROTATOR CUFF REPAIR Left 1990s   Social History   Occupational History   Not on file  Tobacco Use   Smoking status: Never   Smokeless tobacco: Never  Vaping Use   Vaping Use: Never used  Substance and Sexual Activity   Alcohol use: Not Currently   Drug  use: Never   Sexual activity: Not Currently

## 2024-06-09 ENCOUNTER — Ambulatory Visit: Admitting: Orthopedic Surgery

## 2024-06-16 ENCOUNTER — Other Ambulatory Visit: Payer: Self-pay

## 2024-06-16 ENCOUNTER — Encounter: Payer: Self-pay | Admitting: Physician Assistant

## 2024-06-16 ENCOUNTER — Ambulatory Visit: Admitting: Physician Assistant

## 2024-06-16 DIAGNOSIS — Z89512 Acquired absence of left leg below knee: Secondary | ICD-10-CM | POA: Diagnosis not present

## 2024-06-16 DIAGNOSIS — M6281 Muscle weakness (generalized): Secondary | ICD-10-CM

## 2024-06-16 NOTE — Progress Notes (Signed)
 "  Office Visit Note   Patient: Donald [name]           Date of Birth: Feb 19, 1951           MRN: [id number] Visit Date: 06/16/2024              Requested by: Delores Perkins, PA-C [address] [place],  [place] [address] PCP: Delores Perkins, PA-C  Chief Complaint  Patient presents with   Left Leg - Follow-up    Left BKA      HPI: 74 y/o male with left BKA.  He states the prosthesis is loose and causing him to fall.  He denies wounds or edema in the left stump.  He has had the prosthesis for 3 years now.    He also mentioned he has weakness with both hands and it started about a month ago.  He woke up and thought he sleep wrong or cut off the circulation to his arms sleeping on them.  He noticed that it is hard to pick things up with his thumbs and pointer finger.  No no injury.      Assessment & Plan: Visit Diagnoses:  1. Hand muscle weakness   2. Status post below knee amputation, left (HCC)     Plan: Left BKA with stump muscle waisting He has subsiding in the socket and the socket no longer holds suction.  He states the socket slides off due to ill fitting and muscle waisting of the left BKA stump.  His current prosthesis is 74 years old.  He is here for new prosthetic socket, sleeve, worn out prosthetic foot and supply's.    cervical spine problems (C8/T1) Cervical Radiculopathy: Compression or irritation of the C8 or T1 nerve roots in the neck can affect thenar muscles. Verse carpal tunnel, and median nerve palsy.    I will order and MRI of the cervical spine and he will f/u to discuss the results.    Follow-Up Instructions: Return if symptoms worsen or fail to improve, for he will call after the MRI to scedule his appointment.   Ortho Exam  Patient is alert, oriented, no adenopathy, well-dressed, normal affect, normal respiratory effort. The left prosthesis has a wornout foot piece, liner and the socket is too large due to muscle waisting.  Patient has significant muscle  atrophy in the residual limb.   Bilateral thenar muscle waisting.  Weak pincher grip.  Palpable Radial pulses B UE.  He reports tingling and numbness in bilateral hands.  No edema or cellulitis.  Grip strength 4-/5 bilateral hands.       Imaging: Loss of cervical lordosis, DDD C3-T1.  Cervical spondylosis  Labs: Lab Results  Component Value Date   HGBA1C 5.6 12/22/2017   ESRSEDRATE 129 (H) 12/22/2017   CRP 26.8 (H) 12/22/2017   REPTSTATUS 12/27/2017 FINAL 12/21/2017   CULT  12/21/2017    NO GROWTH 5 DAYS Performed at [facility], [address] [place], [place] [address]      Lab Results  Component Value Date   ALBUMIN 1.7 (L) 12/26/2017   ALBUMIN 1.7 (L) 12/25/2017   ALBUMIN 1.7 (L) 12/24/2017   PREALBUMIN <5 (L) 12/22/2017    No results found for: MG No results found for: VD25OH  Lab Results  Component Value Date   PREALBUMIN <5 (L) 12/22/2017      Latest Ref Rng & Units 12/27/2017    7:22 AM 12/26/2017    5:55 AM 12/25/2017  5:34 AM  CBC EXTENDED  WBC 4.0 - 10.5 K/uL 16.5  17.8  28.3   RBC 4.22 - 5.81 MIL/uL 2.71  2.71  2.84   Hemoglobin 13.0 - 17.0 g/dL 8.3  8.5  8.8   HCT 60.9 - 52.0 % 25.5  24.7  25.9   Platelets 150 - 400 K/uL 542  435  451   NEUT# 1.7 - 7.7 K/uL  13.9  23.8   Lymph# 0.7 - 4.0 K/uL  1.6  1.7      There is no height or weight on file to calculate BMI.  Orders:  Orders Placed This Encounter  Procedures   XR Cervical Spine 2 or 3 views   MR Cervical Spine w/o contrast   No orders of the defined types were placed in this encounter.    Procedures: No procedures performed  Clinical Data: No additional findings.  ROS:  All other systems negative, except as noted in the HPI. Review of Systems  Objective: Vital Signs: There were no vitals taken for this visit.  Specialty Comments:  No specialty comments available.  PMFS History: Patient Active Problem List   Diagnosis Date Noted   Below-knee amputation of left  lower extremity (HCC)    Postoperative pain    Type II diabetes mellitus (HCC) 12/22/2017   Essential hypertension 12/22/2017   Renal insufficiency, mild 12/22/2017   Hyponatremia 12/22/2017   Hyperbilirubinemia 12/22/2017   Severe protein-calorie malnutrition    Diabetic polyneuropathy associated with type 2 diabetes mellitus (HCC)    Past Medical History:  Diagnosis Date   Diabetic peripheral neuropathy (HCC)    Gangrene of left foot (HCC)    High cholesterol    Hypertension    OSA on CPAP    Type II diabetes mellitus (HCC)     History reviewed. No pertinent family history.  Past Surgical History:  Procedure Laterality Date   AMPUTATION Left 12/25/2017   Procedure: LEFT BELOW KNEE AMPUTATION;  Surgeon: Harden Jerona GAILS, MD;  Location: [facility] OR;  Service: Orthopedics;  Laterality: Left;   HEEL SPUR EXCISION Bilateral 1992   ROTATOR CUFF REPAIR Left 1990s   Social History   Occupational History   Not on file  Tobacco Use   Smoking status: Never   Smokeless tobacco: Never  Vaping Use   Vaping status: Never Used  Substance and Sexual Activity   Alcohol use: Not Currently   Drug use: Never   Sexual activity: Not Currently       "

## 2024-06-18 ENCOUNTER — Ambulatory Visit
Admission: RE | Admit: 2024-06-18 | Discharge: 2024-06-18 | Disposition: A | Source: Ambulatory Visit | Attending: Physician Assistant | Admitting: Physician Assistant

## 2024-06-18 DIAGNOSIS — M6281 Muscle weakness (generalized): Secondary | ICD-10-CM

## 2024-06-21 ENCOUNTER — Ambulatory Visit: Payer: Self-pay

## 2024-06-21 ENCOUNTER — Other Ambulatory Visit: Payer: Self-pay | Admitting: Physician Assistant

## 2024-06-21 DIAGNOSIS — M6281 Muscle weakness (generalized): Secondary | ICD-10-CM

## 2024-06-21 NOTE — Telephone Encounter (Signed)
 Pt informed of results and Dr. Eldonna office will call him for an appointment.

## 2024-06-21 NOTE — Telephone Encounter (Signed)
-----   Message from Maurilio Deland Collet sent at 06/21/2024 11:19 AM EST ----- I am referring him to Dr. Eldonna for Nerve conduction testing his MRI did not indicate a reason for his hand weakness.  THX [facility]

## 2024-07-12 ENCOUNTER — Encounter: Admitting: Physical Medicine and Rehabilitation
